# Patient Record
Sex: Male | Born: 1940 | Race: White | Hispanic: No | Smoking: Never smoker
Health system: Southern US, Community
[De-identification: ages and names within clinical notes are randomized; demographics above are authoritative.]

## PROBLEM LIST (undated history)

## (undated) DIAGNOSIS — N189 Chronic kidney disease, unspecified: Secondary | ICD-10-CM

## (undated) DIAGNOSIS — Z5189 Encounter for other specified aftercare: Secondary | ICD-10-CM

## (undated) DIAGNOSIS — I639 Cerebral infarction, unspecified: Secondary | ICD-10-CM

## (undated) DIAGNOSIS — D649 Anemia, unspecified: Secondary | ICD-10-CM

## (undated) DIAGNOSIS — J45909 Unspecified asthma, uncomplicated: Secondary | ICD-10-CM

## (undated) DIAGNOSIS — M1990 Unspecified osteoarthritis, unspecified site: Secondary | ICD-10-CM

## (undated) DIAGNOSIS — E785 Hyperlipidemia, unspecified: Secondary | ICD-10-CM

## (undated) DIAGNOSIS — I10 Essential (primary) hypertension: Secondary | ICD-10-CM

## (undated) DIAGNOSIS — K219 Gastro-esophageal reflux disease without esophagitis: Secondary | ICD-10-CM

## (undated) DIAGNOSIS — F32A Depression, unspecified: Secondary | ICD-10-CM

## (undated) DIAGNOSIS — C801 Malignant (primary) neoplasm, unspecified: Secondary | ICD-10-CM

## (undated) DIAGNOSIS — F329 Major depressive disorder, single episode, unspecified: Secondary | ICD-10-CM

## (undated) DIAGNOSIS — F419 Anxiety disorder, unspecified: Secondary | ICD-10-CM

## (undated) HISTORY — DX: Anxiety disorder, unspecified: F41.9

## (undated) HISTORY — DX: Major depressive disorder, single episode, unspecified: F32.9

## (undated) HISTORY — DX: Encounter for other specified aftercare: Z51.89

## (undated) HISTORY — DX: Malignant (primary) neoplasm, unspecified: C80.1

## (undated) HISTORY — DX: Chronic kidney disease, unspecified: N18.9

## (undated) HISTORY — DX: Anemia, unspecified: D64.9

## (undated) HISTORY — DX: Gastro-esophageal reflux disease without esophagitis: K21.9

## (undated) HISTORY — DX: Depression, unspecified: F32.A

## (undated) HISTORY — DX: Unspecified asthma, uncomplicated: J45.909

---

## 1999-01-30 ENCOUNTER — Encounter: Payer: Self-pay | Admitting: Family Medicine

## 1999-01-30 ENCOUNTER — Ambulatory Visit (HOSPITAL_COMMUNITY): Admission: RE | Admit: 1999-01-30 | Discharge: 1999-01-30 | Payer: Self-pay | Admitting: Family Medicine

## 2004-06-26 ENCOUNTER — Ambulatory Visit: Payer: Self-pay | Admitting: Family Medicine

## 2004-06-26 ENCOUNTER — Ambulatory Visit: Payer: Self-pay | Admitting: *Deleted

## 2004-07-23 ENCOUNTER — Ambulatory Visit: Payer: Self-pay | Admitting: Family Medicine

## 2004-08-05 ENCOUNTER — Ambulatory Visit: Payer: Self-pay | Admitting: Family Medicine

## 2004-08-20 ENCOUNTER — Ambulatory Visit: Payer: Self-pay | Admitting: Family Medicine

## 2004-09-17 ENCOUNTER — Ambulatory Visit: Payer: Self-pay | Admitting: Family Medicine

## 2004-11-12 ENCOUNTER — Ambulatory Visit: Payer: Self-pay | Admitting: Family Medicine

## 2004-11-13 ENCOUNTER — Ambulatory Visit: Payer: Self-pay | Admitting: Family Medicine

## 2004-11-14 ENCOUNTER — Emergency Department (HOSPITAL_COMMUNITY): Admission: EM | Admit: 2004-11-14 | Discharge: 2004-11-14 | Payer: Self-pay | Admitting: Unknown Physician Specialty

## 2004-11-15 ENCOUNTER — Ambulatory Visit: Payer: Self-pay | Admitting: Sports Medicine

## 2004-11-15 ENCOUNTER — Inpatient Hospital Stay (HOSPITAL_COMMUNITY): Admission: EM | Admit: 2004-11-15 | Discharge: 2004-11-20 | Payer: Self-pay | Admitting: Emergency Medicine

## 2004-11-18 ENCOUNTER — Ambulatory Visit: Payer: Self-pay | Admitting: Internal Medicine

## 2004-11-27 ENCOUNTER — Ambulatory Visit: Payer: Self-pay | Admitting: Family Medicine

## 2004-12-10 ENCOUNTER — Ambulatory Visit: Payer: Self-pay | Admitting: Family Medicine

## 2005-01-13 ENCOUNTER — Ambulatory Visit: Payer: Self-pay | Admitting: Internal Medicine

## 2005-01-13 ENCOUNTER — Encounter (INDEPENDENT_AMBULATORY_CARE_PROVIDER_SITE_OTHER): Payer: Self-pay | Admitting: *Deleted

## 2005-01-13 ENCOUNTER — Inpatient Hospital Stay (HOSPITAL_COMMUNITY): Admission: EM | Admit: 2005-01-13 | Discharge: 2005-01-18 | Payer: Self-pay | Admitting: Emergency Medicine

## 2005-02-18 ENCOUNTER — Ambulatory Visit: Payer: Self-pay | Admitting: Family Medicine

## 2005-04-04 ENCOUNTER — Ambulatory Visit: Payer: Self-pay | Admitting: Oncology

## 2005-11-04 ENCOUNTER — Ambulatory Visit: Payer: Self-pay | Admitting: Oncology

## 2007-02-23 ENCOUNTER — Emergency Department (HOSPITAL_COMMUNITY): Admission: EM | Admit: 2007-02-23 | Discharge: 2007-02-24 | Payer: Self-pay | Admitting: Emergency Medicine

## 2007-03-10 ENCOUNTER — Emergency Department (HOSPITAL_COMMUNITY): Admission: EM | Admit: 2007-03-10 | Discharge: 2007-03-10 | Payer: Self-pay | Admitting: Emergency Medicine

## 2007-03-13 ENCOUNTER — Inpatient Hospital Stay (HOSPITAL_COMMUNITY): Admission: EM | Admit: 2007-03-13 | Discharge: 2007-03-16 | Payer: Self-pay | Admitting: Emergency Medicine

## 2007-03-19 ENCOUNTER — Ambulatory Visit: Payer: Self-pay | Admitting: Internal Medicine

## 2007-07-23 ENCOUNTER — Emergency Department (HOSPITAL_COMMUNITY): Admission: EM | Admit: 2007-07-23 | Discharge: 2007-07-24 | Payer: Self-pay | Admitting: Emergency Medicine

## 2007-07-25 ENCOUNTER — Inpatient Hospital Stay (HOSPITAL_COMMUNITY): Admission: EM | Admit: 2007-07-25 | Discharge: 2007-07-28 | Payer: Self-pay | Admitting: *Deleted

## 2007-07-25 ENCOUNTER — Ambulatory Visit: Payer: Self-pay | Admitting: Internal Medicine

## 2007-08-06 ENCOUNTER — Emergency Department (HOSPITAL_COMMUNITY): Admission: EM | Admit: 2007-08-06 | Discharge: 2007-08-07 | Payer: Self-pay | Admitting: Emergency Medicine

## 2007-08-08 ENCOUNTER — Inpatient Hospital Stay (HOSPITAL_COMMUNITY): Admission: EM | Admit: 2007-08-08 | Discharge: 2007-08-12 | Payer: Self-pay | Admitting: Emergency Medicine

## 2007-08-08 ENCOUNTER — Ambulatory Visit: Payer: Self-pay | Admitting: Internal Medicine

## 2007-08-10 ENCOUNTER — Encounter: Payer: Self-pay | Admitting: Gastroenterology

## 2007-08-13 ENCOUNTER — Ambulatory Visit: Payer: Self-pay | Admitting: Gastroenterology

## 2007-12-22 ENCOUNTER — Emergency Department (HOSPITAL_COMMUNITY): Admission: EM | Admit: 2007-12-22 | Discharge: 2007-12-23 | Payer: Self-pay | Admitting: Emergency Medicine

## 2007-12-24 ENCOUNTER — Inpatient Hospital Stay (HOSPITAL_COMMUNITY): Admission: EM | Admit: 2007-12-24 | Discharge: 2007-12-27 | Payer: Self-pay | Admitting: Emergency Medicine

## 2007-12-24 ENCOUNTER — Emergency Department (HOSPITAL_COMMUNITY): Admission: EM | Admit: 2007-12-24 | Discharge: 2007-12-24 | Payer: Self-pay | Admitting: Emergency Medicine

## 2007-12-24 ENCOUNTER — Ambulatory Visit: Payer: Self-pay | Admitting: Cardiology

## 2007-12-30 ENCOUNTER — Ambulatory Visit: Payer: Self-pay | Admitting: Gastroenterology

## 2008-01-17 ENCOUNTER — Emergency Department (HOSPITAL_COMMUNITY): Admission: EM | Admit: 2008-01-17 | Discharge: 2008-01-17 | Payer: Self-pay | Admitting: Emergency Medicine

## 2008-01-18 ENCOUNTER — Observation Stay (HOSPITAL_COMMUNITY): Admission: EM | Admit: 2008-01-18 | Discharge: 2008-01-20 | Payer: Self-pay | Admitting: Emergency Medicine

## 2008-01-18 ENCOUNTER — Ambulatory Visit: Payer: Self-pay | Admitting: Internal Medicine

## 2008-05-15 ENCOUNTER — Emergency Department (HOSPITAL_COMMUNITY): Admission: EM | Admit: 2008-05-15 | Discharge: 2008-05-15 | Payer: Self-pay | Admitting: Emergency Medicine

## 2009-08-30 ENCOUNTER — Observation Stay (HOSPITAL_COMMUNITY): Admission: EM | Admit: 2009-08-30 | Discharge: 2009-08-31 | Payer: Self-pay | Admitting: Emergency Medicine

## 2009-12-01 ENCOUNTER — Emergency Department (HOSPITAL_COMMUNITY)
Admission: EM | Admit: 2009-12-01 | Discharge: 2009-12-01 | Payer: Self-pay | Source: Home / Self Care | Admitting: Emergency Medicine

## 2009-12-04 ENCOUNTER — Observation Stay (HOSPITAL_COMMUNITY): Admission: EM | Admit: 2009-12-04 | Discharge: 2009-12-06 | Payer: Self-pay | Admitting: Emergency Medicine

## 2009-12-04 ENCOUNTER — Ambulatory Visit: Payer: Self-pay | Admitting: Cardiovascular Disease

## 2009-12-05 ENCOUNTER — Encounter (INDEPENDENT_AMBULATORY_CARE_PROVIDER_SITE_OTHER): Payer: Self-pay | Admitting: Internal Medicine

## 2009-12-21 ENCOUNTER — Emergency Department (HOSPITAL_COMMUNITY)
Admission: EM | Admit: 2009-12-21 | Discharge: 2009-12-21 | Payer: Self-pay | Source: Home / Self Care | Admitting: Emergency Medicine

## 2010-02-09 ENCOUNTER — Emergency Department (HOSPITAL_COMMUNITY): Admission: EM | Admit: 2010-02-09 | Discharge: 2010-02-10 | Payer: Self-pay | Admitting: Emergency Medicine

## 2010-02-27 ENCOUNTER — Inpatient Hospital Stay (HOSPITAL_COMMUNITY): Admission: EM | Admit: 2010-02-27 | Discharge: 2010-02-28 | Payer: Self-pay | Admitting: Emergency Medicine

## 2010-03-09 ENCOUNTER — Emergency Department (HOSPITAL_COMMUNITY): Admission: EM | Admit: 2010-03-09 | Discharge: 2010-03-09 | Payer: Self-pay | Admitting: Emergency Medicine

## 2010-03-13 ENCOUNTER — Inpatient Hospital Stay (HOSPITAL_COMMUNITY): Admission: EM | Admit: 2010-03-13 | Discharge: 2010-03-15 | Payer: Self-pay | Admitting: Emergency Medicine

## 2010-04-01 ENCOUNTER — Ambulatory Visit: Payer: Self-pay | Admitting: Psychiatry

## 2010-11-09 LAB — DIFFERENTIAL
Basophils Absolute: 0 10*3/uL (ref 0.0–0.1)
Basophils Relative: 0 % (ref 0–1)
Lymphocytes Relative: 10 % — ABNORMAL LOW (ref 12–46)
Lymphs Abs: 1.2 10*3/uL (ref 0.7–4.0)
Monocytes Absolute: 0.6 10*3/uL (ref 0.1–1.0)
Monocytes Relative: 6 % (ref 3–12)
Neutro Abs: 11.3 10*3/uL — ABNORMAL HIGH (ref 1.7–7.7)
Neutro Abs: 9.9 10*3/uL — ABNORMAL HIGH (ref 1.7–7.7)
Neutrophils Relative %: 84 % — ABNORMAL HIGH (ref 43–77)
Neutrophils Relative %: 86 % — ABNORMAL HIGH (ref 43–77)

## 2010-11-09 LAB — GLUCOSE, CAPILLARY
Glucose-Capillary: 128 mg/dL — ABNORMAL HIGH (ref 70–99)
Glucose-Capillary: 143 mg/dL — ABNORMAL HIGH (ref 70–99)
Glucose-Capillary: 145 mg/dL — ABNORMAL HIGH (ref 70–99)
Glucose-Capillary: 148 mg/dL — ABNORMAL HIGH (ref 70–99)
Glucose-Capillary: 150 mg/dL — ABNORMAL HIGH (ref 70–99)
Glucose-Capillary: 155 mg/dL — ABNORMAL HIGH (ref 70–99)
Glucose-Capillary: 158 mg/dL — ABNORMAL HIGH (ref 70–99)
Glucose-Capillary: 178 mg/dL — ABNORMAL HIGH (ref 70–99)
Glucose-Capillary: 72 mg/dL (ref 70–99)

## 2010-11-09 LAB — CBC
HCT: 39.3 % (ref 39.0–52.0)
HCT: 39.9 % (ref 39.0–52.0)
HCT: 42.2 % (ref 39.0–52.0)
HCT: 42.3 % (ref 39.0–52.0)
HCT: 51 % (ref 39.0–52.0)
HCT: 53.7 % — ABNORMAL HIGH (ref 39.0–52.0)
Hemoglobin: 13.4 g/dL (ref 13.0–17.0)
Hemoglobin: 14 g/dL (ref 13.0–17.0)
Hemoglobin: 14.1 g/dL (ref 13.0–17.0)
Hemoglobin: 15.9 g/dL (ref 13.0–17.0)
MCH: 30.9 pg (ref 26.0–34.0)
MCH: 30.9 pg (ref 26.0–34.0)
MCH: 31 pg (ref 26.0–34.0)
MCHC: 33.3 g/dL (ref 30.0–36.0)
MCHC: 33.5 g/dL (ref 30.0–36.0)
MCHC: 33.8 g/dL (ref 30.0–36.0)
MCHC: 34 g/dL (ref 30.0–36.0)
MCV: 90.6 fL (ref 78.0–100.0)
MCV: 90.8 fL (ref 78.0–100.0)
MCV: 91.4 fL (ref 78.0–100.0)
MCV: 92 fL (ref 78.0–100.0)
MCV: 92.5 fL (ref 78.0–100.0)
Platelets: 200 10*3/uL (ref 150–400)
Platelets: 207 10*3/uL (ref 150–400)
Platelets: 235 10*3/uL (ref 150–400)
RBC: 4.27 MIL/uL (ref 4.22–5.81)
RBC: 4.34 MIL/uL (ref 4.22–5.81)
RBC: 4.54 MIL/uL (ref 4.22–5.81)
RBC: 4.57 MIL/uL (ref 4.22–5.81)
RBC: 4.6 MIL/uL (ref 4.22–5.81)
RBC: 5.14 MIL/uL (ref 4.22–5.81)
RBC: 5.63 MIL/uL (ref 4.22–5.81)
RDW: 13 % (ref 11.5–15.5)
RDW: 13.7 % (ref 11.5–15.5)
WBC: 11.5 10*3/uL — ABNORMAL HIGH (ref 4.0–10.5)
WBC: 11.8 10*3/uL — ABNORMAL HIGH (ref 4.0–10.5)
WBC: 12.6 10*3/uL — ABNORMAL HIGH (ref 4.0–10.5)
WBC: 13.2 10*3/uL — ABNORMAL HIGH (ref 4.0–10.5)
WBC: 13.9 10*3/uL — ABNORMAL HIGH (ref 4.0–10.5)
WBC: 17.6 10*3/uL — ABNORMAL HIGH (ref 4.0–10.5)

## 2010-11-09 LAB — URINALYSIS, ROUTINE W REFLEX MICROSCOPIC
Glucose, UA: 100 mg/dL — AB
Hgb urine dipstick: NEGATIVE
Nitrite: NEGATIVE
Protein, ur: >300 mg/dL — AB
Protein, ur: >300 mg/dL — AB
Urobilinogen, UA: 1 mg/dL (ref 0.0–1.0)

## 2010-11-09 LAB — COMPREHENSIVE METABOLIC PANEL
ALT: 17 U/L (ref 0–53)
AST: 21 U/L (ref 0–37)
Albumin: 4.1 g/dL (ref 3.5–5.2)
Alkaline Phosphatase: 78 U/L (ref 39–117)
BUN: 24 mg/dL — ABNORMAL HIGH (ref 6–23)
BUN: 27 mg/dL — ABNORMAL HIGH (ref 6–23)
CO2: 33 mEq/L — ABNORMAL HIGH (ref 19–32)
Calcium: 9.1 mg/dL (ref 8.4–10.5)
Calcium: 9.4 mg/dL (ref 8.4–10.5)
Chloride: 93 mEq/L — ABNORMAL LOW (ref 96–112)
GFR calc Af Amer: 51 mL/min — ABNORMAL LOW (ref >60)
GFR calc non Af Amer: 42 mL/min — ABNORMAL LOW (ref >60)
Glucose, Bld: 181 mg/dL — ABNORMAL HIGH (ref 70–99)
Glucose, Bld: 204 mg/dL — ABNORMAL HIGH (ref 70–99)
Potassium: 3.6 mEq/L (ref 3.5–5.1)
Potassium: 4.1 mEq/L (ref 3.5–5.1)
Sodium: 139 mEq/L (ref 135–145)
Total Bilirubin: 1.5 mg/dL — ABNORMAL HIGH (ref 0.3–1.2)
Total Protein: 7.3 g/dL (ref 6.0–8.3)
Total Protein: 8.2 g/dL (ref 6.0–8.3)

## 2010-11-09 LAB — BASIC METABOLIC PANEL
BUN: 21 mg/dL (ref 6–23)
BUN: 33 mg/dL — ABNORMAL HIGH (ref 6–23)
CO2: 27 mEq/L (ref 19–32)
CO2: 29 mEq/L (ref 19–32)
CO2: 29 mEq/L (ref 19–32)
Calcium: 8.8 mg/dL (ref 8.4–10.5)
Chloride: 100 mEq/L (ref 96–112)
Chloride: 101 mEq/L (ref 96–112)
Chloride: 98 mEq/L (ref 96–112)
Chloride: 99 mEq/L (ref 96–112)
Creatinine, Ser: 1.4 mg/dL (ref 0.4–1.5)
Creatinine, Ser: 1.58 mg/dL — ABNORMAL HIGH (ref 0.4–1.5)
GFR calc Af Amer: 51 mL/min — ABNORMAL LOW (ref >60)
GFR calc Af Amer: 57 mL/min — ABNORMAL LOW (ref >60)
GFR calc Af Amer: >60 mL/min (ref >60)
GFR calc non Af Amer: 42 mL/min — ABNORMAL LOW (ref >60)
Glucose, Bld: 162 mg/dL — ABNORMAL HIGH (ref 70–99)
Glucose, Bld: 94 mg/dL (ref 70–99)
Potassium: 3.3 mEq/L — ABNORMAL LOW (ref 3.5–5.1)
Potassium: 3.4 mEq/L — ABNORMAL LOW (ref 3.5–5.1)
Sodium: 133 mEq/L — ABNORMAL LOW (ref 135–145)
Sodium: 134 mEq/L — ABNORMAL LOW (ref 135–145)

## 2010-11-09 LAB — URINE CULTURE: Colony Count: NO GROWTH

## 2010-11-09 LAB — CK TOTAL AND CKMB (NOT AT ARMC)
CK, MB: 2.7 ng/mL (ref 0.3–4.0)
Relative Index: 2 (ref 0.0–2.5)
Relative Index: INVALID (ref 0.0–2.5)
Total CK: 133 U/L (ref 7–232)
Total CK: 57 U/L (ref 7–232)

## 2010-11-09 LAB — CARDIAC PANEL(CRET KIN+CKTOT+MB+TROPI)
CK, MB: 1.5 ng/mL (ref 0.3–4.0)
CK, MB: 2.1 ng/mL (ref 0.3–4.0)
Relative Index: INVALID (ref 0.0–2.5)
Relative Index: INVALID (ref 0.0–2.5)
Total CK: 43 U/L (ref 7–232)
Total CK: 56 U/L (ref 7–232)

## 2010-11-09 LAB — HEPARIN LEVEL (UNFRACTIONATED): Heparin Unfractionated: 0.37 IU/mL (ref 0.30–0.70)

## 2010-11-09 LAB — URINE MICROSCOPIC-ADD ON

## 2010-11-09 LAB — ALBUMIN: Albumin: 3.4 g/dL — ABNORMAL LOW (ref 3.5–5.2)

## 2010-11-09 LAB — FOLATE: Folate: 8.6 ng/mL

## 2010-11-09 LAB — IRON AND TIBC
Iron: 59 ug/dL (ref 42–135)
TIBC: 177 ug/dL — ABNORMAL LOW (ref 215–435)
UIBC: 118 ug/dL

## 2010-11-09 LAB — HEMOGLOBIN A1C: Mean Plasma Glucose: 154 mg/dL — ABNORMAL HIGH (ref <117)

## 2010-11-09 LAB — POCT CARDIAC MARKERS: Myoglobin, poc: 248 ng/mL (ref 12–200)

## 2010-11-09 LAB — VITAMIN B12: Vitamin B-12: 449 pg/mL (ref 211–911)

## 2010-11-09 LAB — FERRITIN: Ferritin: 425 ng/mL — ABNORMAL HIGH (ref 22–322)

## 2010-11-09 LAB — TSH: TSH: 0.737 u[IU]/mL (ref 0.350–4.500)

## 2010-11-10 LAB — COMPREHENSIVE METABOLIC PANEL
ALT: 18 U/L (ref 0–53)
ALT: 27 U/L (ref 0–53)
AST: 30 U/L (ref 0–37)
AST: 22 U/L (ref 0–37)
Albumin: 4.2 g/dL (ref 3.5–5.2)
Alkaline Phosphatase: 89 U/L (ref 39–117)
BUN: 27 mg/dL — ABNORMAL HIGH (ref 6–23)
CO2: 26 mEq/L (ref 19–32)
Calcium: 9 mg/dL (ref 8.4–10.5)
Calcium: 9.7 mg/dL (ref 8.4–10.5)
Chloride: 95 mEq/L — ABNORMAL LOW (ref 96–112)
Creatinine, Ser: 1.61 mg/dL — ABNORMAL HIGH (ref 0.4–1.5)
Creatinine, Ser: 1.7 mg/dL — ABNORMAL HIGH (ref 0.4–1.5)
GFR calc Af Amer: 52 mL/min — ABNORMAL LOW (ref >60)
GFR calc Af Amer: 49 mL/min — ABNORMAL LOW (ref >60)
GFR calc non Af Amer: 39 mL/min — ABNORMAL LOW (ref >60)
GFR calc non Af Amer: 43 mL/min — ABNORMAL LOW (ref >60)
GFR calc non Af Amer: 40 mL/min — ABNORMAL LOW (ref >60)
Glucose, Bld: 109 mg/dL — ABNORMAL HIGH (ref 70–99)
Glucose, Bld: 157 mg/dL — ABNORMAL HIGH (ref 70–99)
Potassium: 4.2 mEq/L (ref 3.5–5.1)
Sodium: 136 mEq/L (ref 135–145)
Sodium: 136 mEq/L (ref 135–145)
Total Bilirubin: 1.3 mg/dL — ABNORMAL HIGH (ref 0.3–1.2)
Total Protein: 7.3 g/dL (ref 6.0–8.3)

## 2010-11-10 LAB — URINALYSIS, ROUTINE W REFLEX MICROSCOPIC
Bilirubin Urine: NEGATIVE
Glucose, UA: 100 mg/dL — AB
Hgb urine dipstick: NEGATIVE
Ketones, ur: NEGATIVE mg/dL
Leukocytes, UA: NEGATIVE
Leukocytes, UA: NEGATIVE
Nitrite: NEGATIVE
Nitrite: NEGATIVE
Protein, ur: 100 mg/dL — AB
Protein, ur: >300 mg/dL — AB
Specific Gravity, Urine: 1.025 (ref 1.005–1.030)
Specific Gravity, Urine: 1.027 (ref 1.005–1.030)
Urobilinogen, UA: 1 mg/dL (ref 0.0–1.0)
Urobilinogen, UA: 1 mg/dL (ref 0.0–1.0)
pH: 6.5 (ref 5.0–8.0)

## 2010-11-10 LAB — CBC
HCT: 47.4 % (ref 39.0–52.0)
HCT: 51.2 % (ref 39.0–52.0)
Hemoglobin: 16.4 g/dL (ref 13.0–17.0)
MCH: 30.9 pg (ref 26.0–34.0)
MCH: 31.4 pg (ref 26.0–34.0)
MCHC: 33.6 g/dL (ref 30.0–36.0)
MCHC: 34.5 g/dL (ref 30.0–36.0)
MCHC: 34.7 g/dL (ref 30.0–36.0)
MCHC: 34.8 g/dL (ref 30.0–36.0)
MCV: 91.9 fL (ref 78.0–100.0)
MCV: 92.6 fL (ref 78.0–100.0)
MCV: 90.3 fL (ref 78.0–100.0)
Platelets: 215 10*3/uL (ref 150–400)
RDW: 13.4 % (ref 11.5–15.5)
RDW: 13.7 % (ref 11.5–15.5)
RDW: 13.1 % (ref 11.5–15.5)
WBC: 14 10*3/uL — ABNORMAL HIGH (ref 4.0–10.5)

## 2010-11-10 LAB — URINE MICROSCOPIC-ADD ON

## 2010-11-10 LAB — CARDIAC PANEL(CRET KIN+CKTOT+MB+TROPI)
CK, MB: 3 ng/mL (ref 0.3–4.0)
Relative Index: 2.4 (ref 0.0–2.5)
Relative Index: 2.5 (ref 0.0–2.5)
Total CK: 123 U/L (ref 7–232)
Troponin I: 1.31 ng/mL (ref 0.00–0.06)

## 2010-11-10 LAB — BASIC METABOLIC PANEL
CO2: 31 mEq/L (ref 19–32)
Calcium: 8.7 mg/dL (ref 8.4–10.5)
Creatinine, Ser: 1.58 mg/dL — ABNORMAL HIGH (ref 0.4–1.5)
GFR calc Af Amer: 53 mL/min — ABNORMAL LOW (ref >60)
GFR calc non Af Amer: 44 mL/min — ABNORMAL LOW (ref >60)
Sodium: 139 mEq/L (ref 135–145)

## 2010-11-10 LAB — POCT CARDIAC MARKERS
CKMB, poc: 1.7 ng/mL (ref 1.0–8.0)
CKMB, poc: 3 ng/mL (ref 1.0–8.0)
Myoglobin, poc: 195 ng/mL (ref 12–200)
Myoglobin, poc: 340 ng/mL (ref 12–200)
Troponin i, poc: <0.05 ng/mL (ref 0.00–0.09)
Troponin i, poc: <0.05 ng/mL (ref 0.00–0.09)

## 2010-11-10 LAB — TROPONIN I
Troponin I: 1.21 ng/mL (ref 0.00–0.06)
Troponin I: 1.33 ng/mL (ref 0.00–0.06)

## 2010-11-10 LAB — LIPASE, BLOOD
Lipase: 31 U/L (ref 11–59)
Lipase: 25 U/L (ref 11–59)

## 2010-11-10 LAB — DIFFERENTIAL
Basophils Absolute: 0.1 10*3/uL (ref 0.0–0.1)
Basophils Absolute: 0 10*3/uL (ref 0.0–0.1)
Basophils Relative: 1 % (ref 0–1)
Eosinophils Absolute: 0.4 10*3/uL (ref 0.0–0.7)
Eosinophils Absolute: 0 10*3/uL (ref 0.0–0.7)
Lymphocytes Relative: 12 % (ref 12–46)
Lymphocytes Relative: 8 % — ABNORMAL LOW (ref 12–46)
Lymphs Abs: 1.6 10*3/uL (ref 0.7–4.0)
Lymphs Abs: 1.1 10*3/uL (ref 0.7–4.0)
Monocytes Absolute: 0.6 10*3/uL (ref 0.1–1.0)
Monocytes Relative: 6 % (ref 3–12)
Neutro Abs: 12.3 10*3/uL — ABNORMAL HIGH (ref 1.7–7.7)
Neutrophils Relative %: 62 % (ref 43–77)
Neutrophils Relative %: 81 % — ABNORMAL HIGH (ref 43–77)

## 2010-11-10 LAB — GLUCOSE, CAPILLARY
Glucose-Capillary: 133 mg/dL — ABNORMAL HIGH (ref 70–99)
Glucose-Capillary: 97 mg/dL (ref 70–99)

## 2010-11-10 LAB — CK TOTAL AND CKMB (NOT AT ARMC)
Relative Index: 2.7 — ABNORMAL HIGH (ref 0.0–2.5)
Total CK: 113 U/L (ref 7–232)
Total CK: 129 U/L (ref 7–232)

## 2010-11-10 LAB — HEPARIN LEVEL (UNFRACTIONATED): Heparin Unfractionated: 0.23 IU/mL — ABNORMAL LOW (ref 0.30–0.70)

## 2010-11-13 LAB — CARDIAC PANEL(CRET KIN+CKTOT+MB+TROPI)
CK, MB: 1.3 ng/mL (ref 0.3–4.0)
Relative Index: INVALID (ref 0.0–2.5)
Total CK: 45 U/L (ref 7–232)
Troponin I: 1.19 ng/mL (ref 0.00–0.06)
Troponin I: 1.32 ng/mL (ref 0.00–0.06)

## 2010-11-13 LAB — DIFFERENTIAL
Basophils Relative: 0 % (ref 0–1)
Eosinophils Absolute: 0 10*3/uL (ref 0.0–0.7)
Eosinophils Relative: 0 % (ref 0–5)
Lymphocytes Relative: 10 % — ABNORMAL LOW (ref 12–46)
Lymphs Abs: 1.2 10*3/uL (ref 0.7–4.0)
Lymphs Abs: 1.3 10*3/uL (ref 0.7–4.0)
Monocytes Absolute: 1.2 10*3/uL — ABNORMAL HIGH (ref 0.1–1.0)
Monocytes Relative: 8 % (ref 3–12)
Neutro Abs: 10.1 10*3/uL — ABNORMAL HIGH (ref 1.7–7.7)
Neutro Abs: 11.5 10*3/uL — ABNORMAL HIGH (ref 1.7–7.7)

## 2010-11-13 LAB — COMPREHENSIVE METABOLIC PANEL
ALT: 20 U/L (ref 0–53)
AST: 21 U/L (ref 0–37)
Albumin: 3.6 g/dL (ref 3.5–5.2)
Albumin: 3.9 g/dL (ref 3.5–5.2)
Alkaline Phosphatase: 62 U/L (ref 39–117)
Alkaline Phosphatase: 74 U/L (ref 39–117)
BUN: 22 mg/dL (ref 6–23)
CO2: 29 mEq/L (ref 19–32)
Calcium: 9 mg/dL (ref 8.4–10.5)
Calcium: 9.3 mg/dL (ref 8.4–10.5)
Chloride: 103 mEq/L (ref 96–112)
Creatinine, Ser: 1.55 mg/dL — ABNORMAL HIGH (ref 0.4–1.5)
Creatinine, Ser: 1.63 mg/dL — ABNORMAL HIGH (ref 0.4–1.5)
GFR calc Af Amer: 54 mL/min — ABNORMAL LOW (ref >60)
GFR calc non Af Amer: 45 mL/min — ABNORMAL LOW (ref >60)
Glucose, Bld: 134 mg/dL — ABNORMAL HIGH (ref 70–99)
Potassium: 3.1 mEq/L — ABNORMAL LOW (ref 3.5–5.1)
Potassium: 3.3 mEq/L — ABNORMAL LOW (ref 3.5–5.1)
Sodium: 135 mEq/L (ref 135–145)
Total Bilirubin: 0.7 mg/dL (ref 0.3–1.2)
Total Protein: 5.3 g/dL — ABNORMAL LOW (ref 6.0–8.3)
Total Protein: 7.1 g/dL (ref 6.0–8.3)

## 2010-11-13 LAB — HEPARIN LEVEL (UNFRACTIONATED)
Heparin Unfractionated: 0.27 IU/mL — ABNORMAL LOW (ref 0.30–0.70)
Heparin Unfractionated: 0.28 IU/mL — ABNORMAL LOW (ref 0.30–0.70)

## 2010-11-13 LAB — URINALYSIS, ROUTINE W REFLEX MICROSCOPIC
Bilirubin Urine: NEGATIVE
Glucose, UA: 100 mg/dL — AB
Glucose, UA: 250 mg/dL — AB
Hgb urine dipstick: NEGATIVE
Leukocytes, UA: NEGATIVE
Nitrite: NEGATIVE
Specific Gravity, Urine: 1.021 (ref 1.005–1.030)
Specific Gravity, Urine: 1.028 (ref 1.005–1.030)
Urobilinogen, UA: >8 mg/dL — ABNORMAL HIGH (ref 0.0–1.0)
pH: 6.5 (ref 5.0–8.0)

## 2010-11-13 LAB — URINE MICROSCOPIC-ADD ON

## 2010-11-13 LAB — CBC
HCT: 39.6 % (ref 39.0–52.0)
Hemoglobin: 16.4 g/dL (ref 13.0–17.0)
MCHC: 33.7 g/dL (ref 30.0–36.0)
MCHC: 34.4 g/dL (ref 30.0–36.0)
MCHC: 34.5 g/dL (ref 30.0–36.0)
MCHC: 34.5 g/dL (ref 30.0–36.0)
MCV: 92 fL (ref 78.0–100.0)
MCV: 93.1 fL (ref 78.0–100.0)
MCV: 93.5 fL (ref 78.0–100.0)
Platelets: 169 10*3/uL (ref 150–400)
Platelets: 179 10*3/uL (ref 150–400)
Platelets: 225 10*3/uL (ref 150–400)
RBC: 4.36 MIL/uL (ref 4.22–5.81)
RBC: 5.3 MIL/uL (ref 4.22–5.81)
RDW: 13 % (ref 11.5–15.5)
RDW: 13.1 % (ref 11.5–15.5)
RDW: 13.2 % (ref 11.5–15.5)
WBC: 10.5 10*3/uL (ref 4.0–10.5)

## 2010-11-13 LAB — GLUCOSE, CAPILLARY
Glucose-Capillary: 109 mg/dL — ABNORMAL HIGH (ref 70–99)
Glucose-Capillary: 123 mg/dL — ABNORMAL HIGH (ref 70–99)
Glucose-Capillary: 15 mg/dL — CL (ref 70–99)
Glucose-Capillary: 153 mg/dL — ABNORMAL HIGH (ref 70–99)
Glucose-Capillary: 161 mg/dL — ABNORMAL HIGH (ref 70–99)
Glucose-Capillary: 169 mg/dL — ABNORMAL HIGH (ref 70–99)
Glucose-Capillary: 208 mg/dL — ABNORMAL HIGH (ref 70–99)

## 2010-11-13 LAB — CK TOTAL AND CKMB (NOT AT ARMC): Relative Index: INVALID (ref 0.0–2.5)

## 2010-11-13 LAB — POCT I-STAT, CHEM 8
BUN: 26 mg/dL — ABNORMAL HIGH (ref 6–23)
Calcium, Ion: 1.06 mmol/L — ABNORMAL LOW (ref 1.12–1.32)
Chloride: 100 mEq/L (ref 96–112)
Potassium: 3.8 mEq/L (ref 3.5–5.1)

## 2010-11-13 LAB — LIPASE, BLOOD: Lipase: 19 U/L (ref 11–59)

## 2010-11-13 LAB — TSH: TSH: 1.761 u[IU]/mL (ref 0.350–4.500)

## 2010-11-13 LAB — TROPONIN I: Troponin I: 1.44 ng/mL (ref 0.00–0.06)

## 2011-01-07 NOTE — Discharge Summary (Signed)
NAMELINCOLN, [name] NO.:  [id number]   MEDICAL RECORD NO.:  [id number]          PATIENT TYPE:  INP   LOCATION:  6730                         FACILITY:  [facility]   PHYSICIAN:  Isidor Holts, M.D.  DATE OF BIRTH:  23-Jun-1941   DATE OF ADMISSION:  03/13/2007  DATE OF DISCHARGE:  03/16/2007                               DISCHARGE SUMMARY   PRIMARY CARE PHYSICIAN:  Dr. Laveda Abbe, [facility]  Patient is  unassigned to [place].   DISCHARGE DIAGNOSES:  1. Gastric outlet obstruction, secondary to duodenal ulcer.  2. Positive Helicobacter pylori serology, commenced on eradicative      therapy.  3. Dehydration/renal insufficiency.  4. Chronic kidney disease stage 2.  5. Type 2 diabetes mellitus.  6. Gastroesophageal reflux disease.  7. Bronchial asthma.  8. Hypertension.  9. History of cerebral vascular disease.  10.History of prostate cancer.  11.History of polycythemia.   DICTATION:  1. Atenolol 50 mg p.o. daily.  2. Quinapril 20 mg p.o. daily (was on 40 mg p.o. daily).  3. Glipizide ER 5 mg p.o. daily.  4. Albuterol inhaler.  5. Proventil inhaler in pre-admission dosage.  6. Biaxin 500 mg p.o. b.i.d. for 14 days only.  7. Metronidazole 500 mg p.o. b.i.d. for 14 days only.  8. Protonix 40 mg p.o. b.i.d. for 14 days only, and then 40 mg p.o.      daily maintenance indefinitely.   PROCEDURE:  1. Abdominal x-ray dated March 13, 2007.  This showed stable distended      air fluid-filled stomach, question partial gastric outlet      obstruction and gastroparesis.  No evidence of small bowel      obstruction or pneumoperitoneum.  No evidence of acute      cardiopulmonary disease.  2. Abdominal/pelvic CT scan dated March 13, 2007.  This showed duodenal      wall thickening involving the duodenal bulb with para-duodenal fat      stranding and a few small lymph nodes present.  The findings are      most compatible with ulcer with neoplasm being less likely in this  location.  There is also hepatic granulomata.  No acute inter-      pelvic finding.  3. Upper GI series dated March 15, 2007.  This showed scarring and      irregularity of the duodenal bulb, consistent with duodenal ulcer      disease.  No evidence for structural mass.  Also a suggestion of      mild narrowing of the gastroesophageal junction, duodenal      diverticula, mild gastroesophageal reflux identified.   CONSULTATIONS:  1. Dr. Barbette Hair. Arlyce Dice, GI.  2. Dr. Hedwig Morton. Brodie, GI.   HISTORY:  As in the H&P notes of March 13, 2007, dictated by Dr. Della Goo. However, in brief this is a 70 year old male, with known  history of type 2 diabetes mellitus, bronchial asthma, prostate cancer,  hypertension, chronic renal insufficiency, previous cerebrovascular  accident and polycythemia, who presents with a several-day history of  nausea and vomiting, as well as decreased  oral intake associated with  upper abdominal pain.  He was admitted for the further evaluation,  investigation and management.   HOSPITAL COURSE:  #1 - GASTRIC OUTLET OBSTRUCTION:  For the details of  presentation, refer to the admission history, above.  The patient was  managed with bowel rest, intravenous fluid hydration, anti-emetics and  proton pump inhibitors.  An  abdominal x-ray demonstrated gastric  distention suggestive of gastric outlet obstruction.  This was confirmed  by an abdominal CT scan and upper GI series.  For details of these  findings, refer to the procedure list above.  A GI consultation was  called, and was kindly provided by Dr. Lina Sar and Dr. Melvia Heaps.  The patient was offered an upper GI endoscopy, which he has  declined.  By March 14, 2007, the patient no longer had episodes of  vomiting.  He was placed on clear fluids and his diet was advanced.  By  March 16, 2007, the patient was able to tolerate a regular diet, and we  were able to discontinue the intravenous fluids.  H.  pylori serology  done on the recommendation of the gastroenterologist, returned positive  at 3.0 for H. pylori antibodies.  The patient has therefore been started  on eradicative triple therapy with Biaxin, Metronidazole and proton pump  inhibitor.  Of note, he is allergic to PENICILLIN.   #2 - TYPE 2 DIABETES MELLITUS:  While the patient was n.p.o, this was  managed with the sliding scale insulin coverage; however, the patient is  now restarted on his pre-admission dosage of Glipizide and his CBGs were  controlled during the course of his hospitalization.  He is recommended  to follow a carbohydrate-modified diet.   #3 - DEHYDRATION/ACUTE ON CHRONIC RENAL FAILURE:  The patient at the  time of presentation, had a BUN of 30 with a creatinine of 1.72.  He has  a known history of chronic renal insufficiency with creatinine about  1.34 at baseline on February 23, 2007.  He was managed with intravenous fluid  hydration, and as of March 16, 2007, a BUN was 23 and creatinine 1.36,  i.e. at baseline.  The patient of course is a known diabetic.  He will  continue on his ACE inhibitor treatment, following discharge, and will  follow up with his primary M.D.   #4 - HYPERTENSION:  The patient remained normotensive throughout the  course of his hospitalization.  His pre-admission Hydrochlorothiazide  was discontinued, secondary to dehydration and his ACE inhibitor was  also held temporarily; however, as his renal function appears to be at  baseline, it would be prudent to continue his ACE inhibitor treatment,  given the fact that he is diabetic, for renal protective fact.  His  serum creatinine will have to be followed closely by his primary MD.  We  have re-commenced Quinapril in a reduced dose of 20 mg p.o. daily.  Will  leave titration to his primary MD.   #5 - HISTORY OF POLYCYTHEMIA:  There were no problems referrable to  this, during the course of the patient's hospitalization.  His  hemoglobin  was 18.4 at the time of presentation; however, as of March 16, 2007, his hemoglobin was 14.9 with hematocrit of 42.4.  It is likely  that the elevated level was secondary to dehydration.   #6 - HISTORY OF PROSTATE CANCER:  There were no problems referable to  this, during the course of the patient's hospitalization.  The patient  is to follow up with his primary MD.   #7 - HISTORY OF BRONCHIAL ASTHMA:  The patient remained asymptomatic  from this viewpoint, throughout the course of his hospitalization.   DISPOSITION:  The patient was considered recovered and stable on March 16, 2007, and was very keen to be discharged.  He was therefore  discharged accordingly.   DIET:  A heart-healthy diet/carbohydrate-modified diet.   ACTIVITY:  As tolerated.   FOLLOW-UP INSTRUCTIONS:  The patient is to follow up with his primary  MD, Dr. Laveda Abbe at [facility] on March 19, 2007, per the prior  scheduled appointment.      Isidor Holts, M.D.  Electronically Signed     CO/MEDQ  D:  03/16/2007  T:  03/16/2007  Job:  [id number]

## 2011-01-07 NOTE — H&P (Signed)
[name], [name] NO.:  [id number]   MEDICAL RECORD NO.:  [id number]          PATIENT TYPE:  INP   LOCATION:  6730                         FACILITY:  [facility]   PHYSICIAN:  Della Goo, M.D. DATE OF BIRTH:  Sep 17, 1940   DATE OF ADMISSION:  03/13/2007  DATE OF DISCHARGE:                              HISTORY & PHYSICAL   PRIMARY CARE PHYSICIAN:  This is an unassigned patient.   CHIEF COMPLAINT:  Increased nausea, vomiting   HISTORY OF PRESENT ILLNESS:  This is a 70 year old male with type 2  diabetes who presented to the emergency department secondary to  complaints of continued nausea and vomiting for several days along with  decreased p.o. intake.  The patient reports he has also had abdominal  pain which he describes as dull pain for the past 5-6 hours.  He denies  having any hematemesis; however, he has been vomiting bilious material..  He denies having any fevers, chills, chest pain, shortness of breath.  He also denies having any diarrhea or constipation.  He denies having  dizziness, weakness and syncope.   PAST MEDICAL HISTORY:  1. Type 2 diabetes mellitus.  2. Asthma.  3. History of untreated prostate cancer.  4. Hypertension.  5. Chronic renal insufficiency.  6. Previous cerebrovascular accident.  7. The patient also has a history of polycythemia   MEDICATIONS:  1. Atenolol 50 mg one p.o. daily.  2. Quinapril 40 mg one p.o. daily.  3. Albuterol inhaler p.r.n.   ALLERGIES:  PENICILLIN causes swelling.   SOCIAL HISTORY:  The patient is disabled secondary to his  cerebrovascular accident and currently lives with his mother.  He is a  nonsmoker, nondrinker.   FAMILY HISTORY:  Positive for diabetes mellitus in his father, positive  for prostate cancer in his paternal grandfather's brother.   REVIEW OF SYSTEMS:  Pertinent for mentioned above.   PHYSICAL EXAMINATION:  GENERAL:  A 70 year old obese male in discomfort  but no acute  distress.  VITAL SIGNS:  Temperature 97, blood pressure 137/90 to 153/85, heart  rate 52-63, respirations 18, O2 saturation 95%.  HEENT:  Normocephalic, atraumatic.  Pupils round and reactive to light.  Extraocular muscles are intact. Funduscopic benign. Oropharynx clear.  NECK:  Supple. Full range of motion.  No thyromegaly, adenopathy,  jugular venous distension.  CARDIOVASCULAR:  Regular rate and rhythm.  No murmurs, gallops or rubs.  LUNGS:  Clear to auscultation bilaterally.  ABDOMEN:  Positive bowel sounds, mildly decreased. Soft, nontender,  nondistended.  No hepatosplenomegaly.  No rebound, no guarding.  EXTREMITIES:  Without cyanosis, clubbing or edema.  RECTAL AND GENITOURINARY:  Deferred.  NEUROLOGIC:  The patient is alert and oriented x3.  He is able to move  all four of his extremities.   LABORATORY STUDIES:  White blood cell count 14.5, hemoglobin 18.4,  hematocrit 53.3, platelets 244, neutrophils 84% lymphocytes 9%.  Sodium  136, potassium 4.0, chloride 93, bicarb 32, BUN 30, creatinine 1.72,  glucose 167.  Cardiac enzymes:  Myoglobin 245, CK-MB 1.2, troponin less  than 0.05.   Abdominal films reveal possible gastric outlet  obstruction versus  gastroparesis.   Urinalysis negative for nitrites, negative for leukocyte esterase,  glucose equal to 100, trace hemoglobin, small bilirubin, and greater  than 300 protein   ASSESSMENT:  A 70 year old male being admitted with  1. Intractable nausea and vomiting.  2. Dehydration.  3. Type 2 diabetes mellitus.  4. Untreated prostate cancer.  5. Polycythemia.   PLAN:  The patient will be admitted and placed on IV fluids for  rehydration and maintenance therapy.  He will be n.p.o. for now. IV  antiemetics have been ordered along with IV Reglan for nausea.  The  patient will have a CAT scan of the abdomen and pelvis with contrast  performed to further evaluate for gastric outlet obstruction but also to  further evaluate his  prostate disease.  The patient will be placed on  Lopressor p.r.n. elevated blood pressure.  His quinapril therapy will be  placed on hold for now.  DVT and GI prophylaxes have been ordered.      Della Goo, M.D.  Electronically Signed     HJ/MEDQ  D:  03/13/2007  T:  03/14/2007  Job:  [id number]

## 2011-01-07 NOTE — Discharge Summary (Signed)
[name], MILSTEIN NO.:  [id number]   MEDICAL RECORD NO.:  [id number]          PATIENT TYPE:  INP   LOCATION:  5011                         FACILITY:  [facility]   PHYSICIAN:  Elby Showers, MD    DATE OF BIRTH:  Apr 10, 1941   DATE OF ADMISSION:  07/25/2007  DATE OF DISCHARGE:  07/28/2007                               DISCHARGE SUMMARY   DISCHARGE DIAGNOSES:  1. Gastroenteritis versus peptic ulcer disease.  2. Anemia.  3. Diabetes type 2.  4. Hypertension.  5. Hyperbilirubinemia.   DISCHARGE MEDICATIONS:  The patient is being discharged on the following  medications:  1. Atenolol 50 mg 1 tablet daily.  2. Protonix 40 mg 1 tablet daily.  3. Glipizide 5 mg 1 tablet daily.  4. Quinapril 20 mg 1 tablet daily.   DISPOSITION/FOLLOW UP:  This patient is being discharged in stable  condition.  The nausea and vomiting that brought him to the hospital  have resolved.  He is tolerating a regular diet and has no abdominal  pain.  He will be following up with Dr. Beverley Fiedler at [facility]  on August 10, 2007 at 3:15.  At this time, a CBC and BMET should be  checked.   PROCEDURE:  Procedures performed during this hospitalization include:  1. 07/26/07: Ultrasound of the abdomen. Unremarkable exam, small renal      cysts, no gallstones.  2. 07/26/07: Chest x-ray. Chronic lingular scarring, no active disease,      no evidence of pneumonia.   CONSULTING PHYSICIAN:  There were no consultations during this  hospitalization.   ADMISSION HISTORY AND PHYSICAL:  [name] is a 70 year old male with a  past medical history of untreated prostate cancer, hypertension,  diabetes type 2, GERD, peptic ulcer disease, presenting with a three-day  history of dark tarry diarrhea and nonbloody bilious vomiting.  He  denies bright red blood per rectum or coffee-ground material emesis. He  denies any recent travel history.  He notes his 85 year old mother has  been sick with a  GI illness.  He says he has been unable to keep  anything down over the last few days and has been dizzy without syncope.  He feels very weak globally.  He is unsure if he has lost weight.  He  has had some similar symptoms one year ago and was treated for peptic  ulcer disease.   PHYSICAL EXAMINATION:  VITAL SIGNS:  On presentation, temperature 97.2.  Blood pressure was 152/89.  Pulse 58.  Respirations 16.  O2 saturation  is 96% on room air.  GENERAL:  Alert, oriented male, somewhat disheveled, in no acute  distress.  HEENT:  Pupils are equal, round and reactive.  Mucous membranes dry.  RESPIRATORY:  Clear to auscultation bilaterally.  CARDIOVASCULAR EXAM:  Mildly bradycardic rate, regular rhythm.  No  murmurs, rubs or gallops.  ABDOMEN:  Positive bowel sounds, soft, nontender, nondistended.  Multiple small cherry hemangiomas over abdominal surface.  RECTAL EXAM:  Globally enlarged prostate, no nodules, good rectal tone.  Brown stool in vault. FOBT negative.  NEUROLOGIC:  Strength 5/5  in all extremities.  Cranial nerves intact.   LABORATORY DATA:  On admission, sodium 136, potassium 3.3, chloride 92,  bicarb 30, BUN 26, creatinine 1.49, glucose 167, bilirubin 2.1, alk phos  69, AST 24, ALT 18, protein 7.4, albumin 3.8, calcium 9.8.  White blood  cell count 14.2, hemoglobin 17.8, platelets 275,000, MCV is 90.8.  U/A  shows ketones greater than 300 mg/dl of protein, nitrate negative,  leukocyte esterase negative.   HOSPITAL COURSE:  1. - Gastroenteritis versus peptic ulcer disease:  The patient had an      elevated white blood cell count and recent sick contact, making      gastroenteritis very likely.  He has in the past, however, been      diagnosed with peptic ulcer disease by upper GI series in June of      2008 which showed scarring and irregularity of the duodenal bulb      consistent with ulcer disease.  No evidence of obstruction.  There      was a mild narrowing identified  in the gastroesophageal junction, a      duodenal diverticula and mild GERD was identified.  These results      support previous results in May of 2006, another upper GI series      which showed partial gastric outlet obstruction.  At that time, May      of 2006, he also had an EGD which showed severe distal esophagitis.      Ulcers were seen in the antrum along pyloric channel, significant      edema obstructing the channel itself, there was partial gastric      obstruction, debris in the duodenal bulb.  Small bowel was not      visible.  There were no visible vessels seen at this time.  Severe      gastritis was noted throughout the gastric mucosa.  During this      hospitalization, however, the patient declined EGD or colonoscopy      despite his report of melena.  He states that he has a friend who      died after an EGD, and he now considers the procedure very      dangerout.  He was continued on his Protonix throughout the hospitalization.  He was  FOBT-negative.  Multiple stool studies were ordered, but the patient has  not had a bowel movements for 24 hours prior to discharge, so there was  no sample collected.  Samples were collected for H. pylori antigen and  that result is pending.  At the time of discharge, the patient is able  to tolerate regular diet.  He reports no further emesis or diarrhea.  He  will be followed up as an outpatient.  1. - Anemia:  Hemoglobin dropped while in hospital from 17 to 12.      Suspect this was a dilutional effect.  He was volume-contracted      secondary to vomiting and diarrhea at presentation.  He was      immediately put on IV fluids.  Hemoglobin now stable greater than      24 hours at 12.8 the day prior to discharge and 14 on day of      discharge.  2. - Diabetes type 2:  A1C checked in hospital was 7.0, indicating      good outpatient control.  We will discharge on the home regimen of      5 mg of glipizide  per day.  3. - Hypertension:   The patient was very well-controlled in hospital      on atenolol and lisinopril.  Will be discharged on previous home      regimen of atenolol and quinapril.  4. - Hyperbilirubinemia:  2.1 at initial presentation.  Abdominal      ultrasound was negative for any signs of cholecystitis.  Bilirubin      trended back to normal during hospitalization.  Fractionated      bilirubin showed total bilirubin 1.9, direct bilirubin 0.3,      indirect bilirubin 1.6, LDH within normal limits at 177,      haptoglobin within normal limits at 153.  Do not suspect hemolysis.      Suspect this may have also been due to volume contraction.  5. - Hypokalemia:  On day of discharge, a.m. labs showed potassium to      be 3.0.  Will replete prior to discharge.  This should be rechecked      at his outpatient follow-up appointment.   VITAL SIGNS:  On day of discharge, temperature 97.8.  Pulse 74.  Respirations 18.  Blood pressure 147/79.  The patient is saturating 100%  on room air.   LABORATORY DATA:  CBC shows white blood cell count of 10.7, hemoglobin  14.0, hematocrit 40.3, platelet count 190,000, MCV is 91.  Sodium 135,  potassium 3.0, chloride 106, bicarb 23, BUN 14, creatinine 1.25, glucose  106, calcium 8.3.  Other labs of interest during this hospitalization:  Urine microalbumin 30.4, random urine creatinine 82.2, lipase within  normal limits at 21.  Helicobacter pylori stool antigen result is  pending.      Elby Showers, MD  Electronically Signed     CW/MEDQ  D:  07/28/2007  T:  07/28/2007  Job:  [id number]   cc:   Fanny Dance. Rankins, M.D.

## 2011-01-07 NOTE — Consult Note (Signed)
NAMETATE, ZAGAL NO.:  [id number]   MEDICAL RECORD NO.:  [id number]          PATIENT TYPE:  INP   LOCATION:  2627                         FACILITY:  [facility]   PHYSICIAN:  Reginia Forts, MD     DATE OF BIRTH:  01/05/1941   DATE OF CONSULTATION:  DATE OF DISCHARGE:                                 CONSULTATION   CONSULTING PHYSICIAN:  Hartley Barefoot, MD   REASON FOR CONSULTATION:  Elevated troponin in the settting of nausea  and vomiting.   HISTORY OF PRESENT ILLNESS:  [name] is a 70 year old Caucasian male  with a history of diabetes mellitus type 2, chronic renal insufficiency  and gastric outlet obstruction, presents as a consultation for elevated  troponin in the setting of significant nausea and vomiting.  The patient  has a history of gastric outlet obstruction secondary to duodenal  ulcers.  For the last week, the patient has had no p.o. intake and no  significant bowel movement.  He did note progressive nausea and vomiting  during the past week and subsequently was admitted today.  He denies any  hematemesis, hematochezia or melena, denies any chest pain but did note  mild shortness of breath.  He was admitted and given bowel rest.  Serial  enzymes, however, demonstrated elevation of troponin initially from 0.05  up to 2.25.  The patient does have a history of significant GI bleeding  from his duodenal ulcer in the past.  A consultation was obtained to  assess for management of elevated troponin, as well as a higher bleeding  risk, due to duodenal ulcer.   PAST MEDICAL HISTORY:  1. Asthma.  2. Hypertension.  3. Diabetes mellitus type 2.  4. Prostate cancer, untreated.  5. Hernia.  6. Chronic renal insufficiency, prior stroke 10 years ago.  7. Polycythemia vera.  8. Gastric outlet obstruction, secondary to duodenal ulcer.  9. Gastroesophageal reflux disease.  10.Depression with psychotic features.   ALLERGIES:  PENICILLIN CAUSING A  RASH.   MEDICATIONS:  1. Atenolol 50 mg p.o. daily.  2. Quinapril 20 mg p.o. daily.  3. Albuterol p.r.n.  4. Glipizide 5 mg p.o. daily.   SOCIAL HISTORY:  The patient smoked for 30 years, he is divorced but  recently quit tobacco use.  He is disabled secondary to his stroke.   FAMILY HISTORY:  Notable for father with diabetes.   REVIEW OF SYSTEMS:  Notable for frequency of urination, weakness,  nausea, vomiting and diarrhea.  Rest of total review of systems is  reviewed and is negative.   PHYSICAL EXAMINATION:  VITAL SIGNS:  Temperature 97, pulse 63, blood  pressure 139/91.  GENERAL:  The patient appears lethargic but arousable.  HEENT:  Normocephalic, atraumatic.  Pupils equally round, reactive to  light.  Extraocular movements are intact.  NECK:  No JVD, no carotid bruits.  CARDIOVASCULAR:  Regular rhythm, normal rate.  No murmurs, rubs or  gallops.  LUNGS:  Clear to auscultation bilaterally.  ABDOMEN:  Decreased breath sounds and diffusely tender to touch but  obese and otherwise nondistended.  EXTREMITIES:  Showed no  cyanosis, clubbing or edema.  MUSCULOSKELETAL:  Demonstrates no joint effusion or tenderness.  NEUROLOGIC:  Cranial nerves 2-12 grossly intact.  No focal  musculoskeletal or sensory deficits.  SKIN:  Demonstrates normal rashes.  LYMPH NODES:  No lymphadenopathy.   EKG demonstrates a rate of 60 with normal sinus rhythm and early  repolarization in the anterior lead.   LABORATORY:  BUN of 32, creatinine is 1.58, troponin is 2.25, MB is 2.2,  CK is 65, hemoglobin is 17.3, hematocrit is 1.1, platelet count is  255,000, white count is 16.5.   ASSESSMENT/PLAN:  1. This is a 70 year old Caucasian male with a history of non-ST      elevation myocardial infarction and supply demand mismatch,      secondary to acute gastric ulcer obstruction.  The patient will      require supportive care with Heparin drip.  This can be      accomplished without a bolus,  especially to minimize risk of      gastrointestinal bleeding.  Atenolol was held initially but should      be given at least at half dose.  The possibility of requiring a      half dose of atenolol in the future may be offset by possibility      for hypotension secondary to acute GI bleed.  The patient is also      to continue aspirin 81 mg daily and a Statin.  Serial CBCs will be      checked, to rule out acute bleed.  2. Disposition.  The patient will require medical stabilization for      his underlying disease.  The patient may require a stress test or      cardiac cath, depending on his symptomatology.  We will continue to      follow along.      Reginia Forts, MD  Electronically Signed     RA/MEDQ  D:  12/25/2007  T:  12/25/2007  Job:  [id number]

## 2011-01-07 NOTE — Discharge Summary (Signed)
NAMECLERENCE, [name] NO.:  [id number]   MEDICAL RECORD NO.:  [id number]          PATIENT TYPE:  INP   LOCATION:  2003                         FACILITY:  [facility]   PHYSICIAN:  Madaline Guthrie, M.D.    DATE OF BIRTH:  28-Nov-1940   DATE OF ADMISSION:  12/24/2007  DATE OF DISCHARGE:  12/27/2007                               DISCHARGE SUMMARY   DISCHARGE DIAGNOSES:  1. 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  2. Non-ST-elevation myocardial infarction.  3. Diabetes mellitus.  4. Leukocytosis, resolved.  5. Hyperbilirubinemia, known history of Gilbert's syndrome.  6. Hypertension.  7. History of tuberculosis.  8. History of prostate cancer, declining workup.  9. Depression with psychotic features.   DISCHARGE MEDICATIONS:  1. Protonix 40 mg twice a day.  2. Vancomycin 500 mg twice a day, to complete a 2-week course.  3. Metronidazole 500 mg twice a day, to complete a 2-week course.  4. Aspirin 81 mg daily.  5. Atenolol 25 mg daily.  6. Reglan 5 mg before meal and at bedtime.  7. Zocor 40 mg once daily in the evening.  8. Quinapril 20 mg daily.  9. Glipizide 5 mg daily.   DISPOSITION AND FOLLOWUP:  A followup appointment has been arranged for  the patient with Dr. Eloise Harman at the [facility].  At the followup visit, the patient is to be reviewed for resolution of  his symptoms of gastric outlet obstruction.  It is to be decided at the  followup visit whether the patient will benefit from any intervention  for his problem of gastric outlet obstruction.  The patient is also  going to follow with his own primary care physician in [place].   STUDIES:  Acute abdominal series with chest x-ray on Dec 24, 2007.  Impression, no active disease in the chest.  Air-fluid levels in  nondilated colon, which may be due to ileus or liquid stool.  The  stomach is dilated and filled with fluid gradient compatible with his  gastric outlet  obstruction as demonstrated on the prior CT scan.   CONSULTS:  1. [facility].  2. Cardiology, Dr. Reginia Forts.   BRIEF ADMISSION HISTORY AND PHYSICAL:  [name] is a 70 year old man  with history of duodenal ulcers and gastritis that were H. pylori  positive and was complicated by gastric outlet obstruction.  He  presented with nausea, vomiting, and abdominal pain.  He complained of  weight loss of about 30 pounds in the past 6 months and abdominal  bloating with intermittent nausea and vomiting of undigested foods.  His  vomitus was mainly watery.  He had been admitted 3 days prior to  admission  with the same complaint.  He mentioned of retching/hiccups  and passing streaks of blood with vomiting, but mostly his vomitus was  watery and coffee-ground, old undigested food.  He mentioned of having  bowel movement about 1-5 times per day and each time they were watery,  though he often went and just passed gas.  He denied any history of  fever,  or any dizziness, abdominal pain  or any problem with his bladder  habit.  He denied any chest pain, palpitations, loss of consciousness,  syncope, diaphoresis, or any other systemic complaints.   ADMISSION PHYSICAL EXAMINATION:  VITAL SIGNS:  Temperature 97, blood  pressure 139/91, pulse 63, respiratory rate 16, and O2 sat 97% on room  air.  GENERAL:  Not in any distress.  EYES:  Equal.  EOMI.  PERRL.  No pallor.  No icterus.  ENT:  Moist mucous membranes.  Oropharynx clear.  NECK:  Supple.  CHEST:  Clear to auscultation bilaterally.  Good air entry.  No added  sounds.  CARDIOVASCULAR:  Regular rate and rhythm.  Normal heart sounds.  No  murmurs, rubs, or gallops.  ABDOMEN:  Soft, nontender, and nondistended.  Normal bowel sounds.  No  palpable organomegaly.  EXTREMITIES:  No edema or calf swelling.  GENITOURINARY:  No CVA tenderness.  SKIN:  No rash.  Normal turgor.  LYMPH:  No lymphadenopathy.  MUSCULOSKELETAL:  No  spinal joint tenderness or swelling or any joint  inflammation.  NEURO:  Alert and oriented x3.  Cranial nerves II through XII intact.  No focal deficits.  PSYCHE:  Appropriate.   ADMISSION LABS:  Sodium 136, potassium 3.6, chloride 95, bicarbonate 28,  BUN 32, creatinine 1.58, blood glucose 146, bilirubin 2.4, alk phos 55,  ALT 16, AST 19, protein 8.1, albumin 3.9, and calcium 9.1.  Hemoglobin  17, white cells 16.5, platelet 255, and ANC 13.8.  MCV 90.  Lipase 21.  FOBT negative.  Urinalysis was positive for more than 200 protein, 50  ketones, otherwise negative.  Electrocardiogram, sinus bradycardia.   HOSPITAL COURSE BY PROBLEMS:  1. Gastric outlet obstruction.  He was admitted and placed NPO.  GI      consultation was done with [place] gastroenterologist.  The patient      had recently had extensive workup in terms of his gastric outlet      obstruction, No further investigation in that line was advised.      The patient was restarted on proton pump inhibitor and H. pylori      eradication with clarithromycin and metronidazole.  The patient was      also placed on Reglan.  The patient responded well to the regimen,      and by the second day of admission, he was able to take it by      mouth.  2. Non-ST-elevation myocardial infarction.  Given the patient's risk      factors as well as mentioning of continuous hiccups and retching, a      set of cardiac enzymes were sent for, which revealed elevated      troponin in the range of 0.05-2.25.  Cardiac evaluation was done      with Dr. Laurelyn Sickle, who recommended catheterization and Myoview, but      the patient declined any further treatment in that line.  Hence,      the patient is placed on Zocor, atenolol, and aspirin, and he is      asked to be followed up with his own primary care physician.  The      patient remained hemodynamically stable throughout his hospital      stay and he never again complained of any chest pain.  3.  Leukocytosis.  The patient did not have a source of infection and      it resolved on its own.  4. Hyperbilirubinemia.  The patient is noted to have known  Gilbert's      syndrome from previous admissions.  No further workup was      undertaken in this line.   DISCHARGE VITALS:  Blood pressure 118/80, temperature 97.9, pulse 59,  respiratory rate 18 per minute, and saturation 99% on room air.   DISCHARGE LABS:  White cells 8.5, hemoglobin 15, MCV 90, and platelets  180. Sodium 135, potassium 3.8, chloride 108, bicarbonate 23, BUN 23,  Creatinine 1.48, Glucose 102.   On the day of discharge patient's nausea and vomiting had completely  subsided and he was eating well and tolerating oral intake.      Zara Council, MD  Electronically Signed      Madaline Guthrie, M.D.  Electronically Signed    AS/MEDQ  D:  12/31/2007  T:  01/01/2008  Job:  [id number]   cc:   Vania Rea. Jarold Motto, MD, Clementeen Graham, FACP, FAGA  Venita Lick. Russella Dar, MD, Clementeen Graham

## 2011-01-10 NOTE — H&P (Signed)
[name], Edwin NO.:  [id number]   MEDICAL RECORD NO.:  [id number]          PATIENT TYPE:  INP   LOCATION:  5017                         FACILITY:  [facility]   PHYSICIAN:  Melina Fiddler, MD DATE OF BIRTH:  05/25/1941   DATE OF ADMISSION:  11/15/2004  DATE OF DISCHARGE:                                HISTORY & PHYSICAL   ADMITTING RESIDENT:  Morley Kos, M.D.   CHIEF COMPLAINT:  Vomiting x 2 weeks.   HISTORY OF PRESENT ILLNESS:  [name] is a 70 year old male with history  of CVA, prostate cancer, and asthma, presenting to the emergency department  for one-week history of vomiting and decreased p.o. intake.  He has a 20-  year history of monthly vomiting, usually occurring once a month and  resolving on its own with rest.  The patient states a one-week episode is  unusual for him.  Reported to the emergency department yesterday and  discharged with the diagnoses of possible esophagitis and hypokalemia.  The  patient was given potassium supplement and started on Protonix.  Vomiting  has not resolved since yesterday.  The patient denies chest pain or  shortness of breath.  No abdominal pain.  The patient does have associated  weakness and decreased appetite.  The patient also reported social stressors  at home due to poor relationship with mother and older sister.  Recently had  big argument today with mother.   PAST MEDICAL HISTORY:  1.  Hypertension.  2.  Asthma.  3.  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  4.  Heart disease.  5.  Prostate cancer.  6.  Herpes.  7.  CVA.   ALLERGIES:  PENICILLIN.   MEDICATIONS:  1.  Atenolol 50 mg p.o. daily.  2.  Glucotrol XL 5 mg p.o. daily.  3.  Promethazine 25 mg p.o. q.6-8h. p.r.n. nausea and vomiting.   SOCIAL HISTORY:  Disabled secondary to CVA and lives with mother, one living  son.  Smoked in his teens.  No recent alcohol use or illicit drug use.   PHYSICAL EXAMINATION:   VITAL SIGNS:  On admission, temperature 97.3, heart  rate 102, respiratory rate 118, blood pressure 141/90.  GENERAL:  Alert and oriented with hiccups, depressed mood, perseverating on  poor family relationships.  HEENT:  Atraumatic and normocephalic.  PERRLA.  EOMI.  Poor dentition.  Mucosa dry.  Oropharynx clear.  NECK:  No lymphadenopathy, no thyromegaly.  CARDIOVASCULAR:  Regular rate and rhythm.  No murmurs, gallops, or rubs  appreciated.  LUNGS:  Clear to auscultation bilaterally.  ABDOMEN:  Soft, nontender, nondistended.  Positive bowel sounds.  SKIN:  No rashes.  EXTREMITIES:  No edema, 5/5 muscle strength.  NEUROLOGIC:  Cranial nerves II-XII intact.  Moves all extremities.  Follows  commands.  Glasgow Coma Scale 15.  PSYCHIATRIC:  Depressed, perseveration on history of herpes and poor  relationship with family.   ADMISSION LABORATORY DATA AND OTHER STUDIES:  WBC 14.6, hemoglobin 19.9,  hematocrit 56.8, platelets 211, MCV 88.4.  Sodium 138, potassium 2.6,  chloride 87, bicarb 36, BUN 40,  creatinine 1.9, glucose 127.  AST 33, ALT  31, alkaline phosphatase 70, bilirubin 3.1, protein 7.5, albumin 4.0,  amylase 55, lipase 22.  PTT 29, PT 12.8, INR 1.0.  Positive gastric occult  blood.  Urinalysis:  Trace hemoglobin, moderate bilirubin, 40 ketones,  greater than 300 protein, few bacteria.  Cardiac enzymes:  CK-MB 3.2,  troponin I less than 0.05, myoglobin greater than 500.   Chest x-ray:  Admission visit prior to today's admission significant for  lingular and left lower lobe scarring/atelectasis.   Barium swallows day prior to this admission in emergency department showed  narrowing of distal esophagus questionably related reflux, no obstruction.   ASSESSMENT AND PLAN:  A 70 year old male with intractable vomiting.   1.  Vomiting.  Not certain of etiology at present.  Per patient, he has      chronic history of emesis but not this severe.  Will treat supportively      for now  with Zofran and repeat electrolytes.  Will also start IV fluids      for now.  May get ultrasound of abdomen to rule out acute process for      infection considering elevated white blood cells and elevated bilirubin.      Narrowing of distal esophagus was noted on barium swallow in emergency      room visit yesterday.  May consult GI for possible stricture.  2.  Hypokalemia.  Will replete and follow.  3.  Diabetes mellitus.  Will start home medications which include Glucotrol      XL and check CBGs q.a.c. and q.h.s.  Also will order sliding scale      insulin.  4.  Asthma.  The patient is stable on room air, but wheezing heard on exam.      Will order albuterol nebulizer x 1 and q.4h. p.r.n. and monitor.  5.  Hypertension.  Elevated currently in the 140s; however, may be secondary      to acute illness, so will for now continue his home medications and      monitor.  6.  Dehydration. Will start IV fluids and monitor. Creatinine is elevated at      1.9 most likely secondary to hypovolemia but will hydrate and monitor      creatinine.  7.  Acute renal failure probably secondary to problems above.  Will hydrate      and monitor.  Will call [facility] for baseline creatinine.  8.  Leukocytosis.  Uncertain etiology, negative UA and chest x-ray at      emergency room yesterday.  The patient is currently afebrile.  My      consider hepatitis panel considering elevated bilirubin.  9.  Disposition.  The patient voiced he is considering going to a skilled      nursing facility or assisted living due to current poor relations with      27 year old mother with whom he lives.  Will advise social work      consideration and follow.      VRE/MEDQ  D:  11/19/2004  T:  11/19/2004  Job:  [id number]   cc:   [facility]

## 2011-01-10 NOTE — Consult Note (Signed)
NAMEYANG, RACK NO.:  [id number]   MEDICAL RECORD NO.:  [id number]          PATIENT TYPE:  OBV   LOCATION:  5017                         FACILITY:  [facility]   PHYSICIAN:  Rose Phi. Myna Hidalgo, M.D. DATE OF BIRTH:  [birth year]   DATE OF CONSULTATION:  11/16/2004  DATE OF DISCHARGE:                                   CONSULTATION   REFERRING PHYSICIAN:  Anselmo Rod, M.D.   REASON FOR CONSULTATION:  Possible polycythemia.   HISTORY OF PRESENT ILLNESS:  Mr. Ruesch is a very nice 70 year old white  gentleman who has multiple medical problems.  He has a history of prostate  cancer 10 years ago.  He really cannot remember what kind of treatment he  had.  He has a history of longstanding asthma and type 2 diabetes,  hypertension and heart disease.  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   He was admitted because of persistent vomiting.  He was subsequently found  to have an esophageal stricture.  He was admitted on March 24.  On  admission, his laboratory studies showed a white cell count of 16,000,  hemoglobin 20, hematocrit 57.9, platelet count was clumped.  He was found  to have positive blood in his gastric secretions.  He had a metabolic panel  which showed a sodium of 138, potassium 2.6, BUN 4.0, creatinine 1.9.  Bilirubin was 3.1.  The rest of his LFTs were normal.  His PT was 12.8 with  a PTT of 29.  A repeat CBC was done on March 24 which showed a white cell  count of 14.6, hemoglobin 19.9, hematocrit 56.8, platelet count 211,000.  White cell differential showed 79 segs, 8 lymphs, 13 monos.   There was some concern about the possibility of an underlying hematologic  issue, and I was subsequently called to see him.  Again, he is not a great  historian.  He does have a history of a CVA.  He does have a history of  heart disease.  He really cannot be more specific about this.  He is not a  vegetarian.  He has been having difficulty swallowing for the  past month or  so.  He denies any type of weight loss.  He has not noted any kind of skin  rashes.  He has had an occasional headache.  There is no double vision or  blurred vision.  He has not had any melena or hematochezia.   PAST MEDICAL HISTORY:  As stated previously.   ALLERGIES:  PENICILLIN.   CURRENT MEDICATIONS:  1.  Potassium 40 mEq p.o. daily.  2.  Atenolol 50 mg p.o. daily.  3.  Glucotrol XL 5 mg p.o. daily.  4.  Protonix 40 mg p.o. daily.  5.  Albuterol nebulizers as needed.   SOCIAL HISTORY:  Negative for alcohol use.  He smoked a long time ago.  He  has had no obvious occupational exposures.  He used to work for __________  [employer].  He denies any exposure.  He denies any obvious risk factors for  HIV,  hepatitis.   FAMILY HISTORY:  Noncontributory.   PHYSICAL EXAMINATION:  GENERAL:  This is a mildly obese white gentleman in  no obvious distress.  VITAL SIGNS:  Temperature 97.2, pulse 105, respiratory rate 22, blood  pressure 125/84.  HEAD/NECK:  Normocephalic, atraumatic skull.  He does have some slight  facial plethora.  He does have some slight conjunctival inflammation.  Pupils react appropriately.  He has no oral lesions.  NECK:  Supple with no adenopathy.  Thyroid is nonpalpable.  LUNGS:  Decreased breath sounds throughout all lung fields.  He has an  occasional expiratory wheeze.  CARDIAC:  Tachycardiac but regular.  He has no murmurs, rubs or bruits.  ABDOMEN:  Mildly obese but soft.  He has good bowel sounds.  There is no  palpable abdominal mass.  There is no palpable hepatosplenomegaly.  BACK:  No tenderness over the spine, ribs or hips.  EXTREMITIES:  No clubbing, cyanosis or edema.  NEUROLOGIC:  No focal neurologic deficits.   LABORATORIES:  Peripheral blood smear shows a normochromic normocytic  population of red blood cells.  There are no nucleated red blood cells.  I  see no teardrop cells.  There are no target cells.  He has no  schistocytes.  White cells appear increased in number.  The increase in white cells appears  to be from mature polys.  I do not see any immature myeloid or lymphoid  cells.  There are no myeloblasts or lymphoblasts.  Platelets are adequate in  number and size.  He had a few large platelets.   IMPRESSION:  Mr. Bauernfeind is a 70 year old gentleman with persistent nausea  and vomiting.  He has an esophageal stricture.  It looks like he is going to  be undergoing an EGD with dilation.   I think that it is possible that he may have an underlying hematologic  disorder.  Again, it is hard to really tell.  The leukocytosis certainly  could be reactive.  Again, the leukocytosis certainly could also be an  indication of a polycythemia.  His increase in hemoglobin also could suggest  polycythemia.  He does not look as if he is a COPD patient with chronically  low oxygen levels, so I do not think this would be a secondary  erythrocytosis.   I do not see any issues with him having to undergo an EGD.  He should be  okay for this.   It is really hard to palpate a liver or spleen on him.  I certainly would  agree with a CT or ultrasound of the abdomen to look for the possibility of  hepatosplenomegaly.   I would send off an erythropoietin level on him.  If this is low, then this  would give more credence to his having an underlying bone marrow disorder.   It is possible that he may need to be phlebotomized.  He has already had a  stroke.  Again, this could be from hypertension.   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  This is otherwise known as [place]'s polycythemia.  This is seen in overweight middle-aged males with hypertension.  It is due  to volume depletion.   For now, I just think that we need to watch him from a hematologic  standpoint.  I do not think he needs to be phlebotomized.  Let us just get some IV fluids into him.  Let  us get his  esophagus dilated and see if this  does not improve his blood parameters.   We will certainly follow him along and make suggestions as needed.      PRE/MEDQ  D:  11/16/2004  T:  11/17/2004  Job:  [id number]   cc:   Melina Fiddler, MD  [address] [place]  [place] [address]  Fax: [phone]

## 2011-01-10 NOTE — Consult Note (Signed)
[name], [name] NO.:  [id number]   MEDICAL RECORD NO.:  [id number]          PATIENT TYPE:  OBV   LOCATION:  5017                         FACILITY:  [facility]   PHYSICIAN:  Anselmo Rod, M.D.  DATE OF BIRTH:  Aug 15, 1941   DATE OF CONSULTATION:  DATE OF DISCHARGE:                                   CONSULTATION   DATE OF CONSULTATION:  November 16, 2004.   REASON FOR CONSULTATION:  Difficulty swallowing.  Question of normal barium  swallow with esophageal stricture.  [facility] requesting an  EGD.   ASSESSMENT:  1.  Question coffee ground emesis versus hemoptysis and a possible distal      esophageal stricture seen on barium swallow.  A questionable reflux      esophagitis.  Rule out malignancy.  2.  History of reflux disease, abnormal liver function tests, total      bilirubin of 3.1.  Liver panel pending.  3.  Abnormal weight loss, weight down from 220 to 195, reasons unclear.  4.  Increased white cell count, hemoglobin, and platelet count.  Question      polycythemia vera.  5.  History of a stroke, heart disease, and lightheadedness. Question PCV.  6.  Increased BUN and creatinine.  Question dehydration.  7.  Proteinuria, etiology unclear.  8.  History of prostate cancer diagnosed by Dr. Boston Service in 1996 by      biopsy.  The patient was advised to have surgery, which he refused.  He      has had no treatment in this regard since then.  9.  Adult onset diabetes mellitus on oral hypoglycemics at home.  10. Hypertension on Accupril.  11. Asthma on nebulizers.  12. On disability.   RECOMMENDATIONS:  1.  A Hematology/Oncology evaluation has been advised as discussed with Dr.      Myna Hidalgo over the phone.  I have procured permission from Dr. Cleophas Dunker to      do so.  A CT of the chest and abdomen would be a good idea considering      his history of prostate cancer and the possible studies of hemoptysis      and his abnormal weight loss.  2.  Continue Protonix 40 mg per day.  3.  EGD on Monday with [facility].  4.  Complete liver panel to evaluate alkaline phosphatase and intact      bilirubin to further make a judgment with regards to his LFT      abnormalities.  5.  History of genital herpes.   DISCUSSION:  [name] is a complicated, 70 year old, white male  with the above-mentioned abnormalities, who came to the emergency room and  admitted earlier this week and was sent home after receiving IV fluids and  potassium supplements.  He presented to the ER again with a history of  hiccups, difficulty swallowing, spitting up blood, a question of vomiting,  and gave a history of a 15 to 20 pound weight loss.  He cannot quantify as  to how long this has occurred.  There  is some epigastric pain with reflux.  He denies a history of the use of PPIs or H2 blockers.  There is no history  of nonsteroidal use.  He has noticed occasional BRBPR, which he attributes  to his hemorrhoids.  He denies problems with melena. There is no history of  ulcers, jaundice, or colitis.  History of recurrent episodes of  lightheadedness, and for over 20 years describes episodes of periodic  vomiting after meals, but this has become more frequent in the recent past.  He denies having any abdominal surgeries.  No cardiorespiratory or  genitourinary complaints at this time.  He has been followed by Dr. Nicki Guadalajara for chest pain and had an evaluation in the past for his hypertension.   SOCIAL HISTORY:  He is disabled.  He worked for [employer] for over 20 years  until the [employer] closed down.  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  He has been on disability for over 10 years.  He denies the use  of alcohol, tobacco, or drugs.  He is divorced and has one living son.  His  daughter was killed by a gunshot wound at the age 28.  He lives with his  mother.   FAMILY HISTORY:  His mother had breast cancer over 20 years ago  and is now  69 years of age and is doing well.  She has arthritis.  His father died at  36 of an MI.  There is a history of breast cancer in a sister as well.  There is no family history of colon or stomach cancer.  He has a brother,  who has a blood disorder requiring phlebotomies.  He has seen a cancer  doctor here in [place].   GENERAL PHYSICAL EXAMINATION:  GENERAL:  A middle-aged white male, somewhat  depressed, but in no acute distress, cooperative in his manner, very  talkative.  VITAL SIGNS:  Stable.  He is afebrile.  NECK:  Supple.  CHEST:  Clear to auscultation, S1 and S2 regular.  No murmur, rub, or  gallop.  No wheezing.  ABDOMEN:  Soft, nondistended, nontender, with normal bowel sounds.  No  hepatosplenomegaly.  No masses palpable.  RECTAL:  Examination was deferred as the patient said he had one in the  emergency room; however, I cannot find a rectal exam on the chart.  SKIN:  Coarse and dry.  NEUROLOGIC:  He seems neurologically intact.   LABORATORY DATA:  White count of 16.4 with a hemoglobin of 20.5 and  hematocrit of 57.9.  Yesterday, platelets were clumped.  There were some  atypical lymphocytes seen.  Today, hemoglobin is 18.3 with white count of  21,000, hematocrit 51.8 with platelets of 243,000.  Sodium 138, potassium  3.3, chloride 88, CO2 37, BUN 45, creatinine 1.9, glucose 134, calcium 8.9,  and magnesium 3.  Hemoglobin A1c is 5.8.   PLANS AND FURTHER RECOMMENDATIONS:  Will admit and follow up.      JNM/MEDQ  D:  11/16/2004  T:  11/17/2004  Job:  [id number]   cc:   Fanny Dance. Rankins, M.D.  [address]  [place]  [place] [address]  Fax: [phone]   Boston Service, M.D.  [address]  [place]  [place] [address]  Fax: [phone]   Nicki Guadalajara, M.D.  [phone] [address]  [place], [place] [address]  Fax: [phone]   Rose Phi. Myna Hidalgo, M.D.  [address]  [facility]  [place], [place] [address] Fax: [phone]   Melina Fiddler, MD  [address] [place]  [place] [address]  Fax: [phone]

## 2011-01-10 NOTE — Discharge Summary (Signed)
[name], [name]   MEDICAL RECORD NO.:  [id number]          PATIENT TYPE:  INP   LOCATION:  5712                         FACILITY:  [facility]   PHYSICIAN:  Zetta Bills, MD          DATE OF BIRTH:  04/26/1941   DATE OF ADMISSION:  01/12/2005  DATE OF DISCHARGE:  01/18/2005                                 DISCHARGE SUMMARY   DISCHARGE DIAGNOSES:  1.  Partial gastric outlet obstruction secondary to edema surrounding a      gastric ulcer.  2.  Severe distal esophagitis.  3.  Severe gastritis.  4.  Nausea and vomiting secondary to partial gastric outlet obstruction,      severe distal esophagitis and severe gastritis.  5.  Hypertension.  6.  Type 2 diabetes mellitus, non-insulin dependent.  7.  Anxiety and depression.   DISCHARGE MEDICATIONS:  1.  Protonix 40 mg p.o. q.12h.  2.  Seroquel 25 mg p.o. q.h.s.  3.  Celexa 15 mg p.o. daily.  4.  Atenolol 25 mg p.o. daily.  5.  Glucotrol XL 5 mg p.o. daily.   DISPOSITION AND FOLLOWUP:  The patient is to be discharged to his own home  and will be followed up as follows:  1.  With Dr. Charna Elizabeth of [facility] in two weeks.  The      patient is to contact Dr. Kenna Gilbert office and to schedule a follow-up      appointment as one could not be obtained on his day of discharge due to      the weekend.  The patient will do so by calling [phone] and the      relative contact information has been provided to him.  2.  The patient has been urged to follow up with Dr. Luciana Axe of [facility] by calling [phone].  The patient has      been following up in the past with Dr. Luciana Axe and has been urged to      schedule a hospital follow-up appointment within three weeks of      discharge in order to continue receiving his regular health care.   CONSULTATIONS:  The major consultations sought during this admission was  with gastroenterology, Dr. Charna Elizabeth and Dr.  Jeani Hawking, both of  [facility] for closely following up with this patient's  hospital progress.   PROCEDURES DURING HOSPITALIZATION:  These were as follows:  1.  On Jan 13, 2005, an abdominal ultrasound was obtained that showed no      evidence of cholelithiasis or acute cholecystitis and it showed dilated      fluid-filled stomach.  2.  On Jan 14, 2005, an upper GI series was done that showed partial gastric      outlet obstruction with esophagitis, gastritis and gastric ulcerations.      The exam was limited with respect to evaluating for presence of ulcers      or malignancy given the limited amount of contrast that the patient was  able to ingest.  3.  On Jan 13, 2005, the patient underwent an esophagogastroduodenoscopy,      (EGD), with antral biopsies that revealed severe distal esophagitis, an      ulcer seen in the antrum along the pyloric channel with significant      edema obstructing the channel itself.  Partial gastric outlet      obstruction was also noted with debris in the duodenal bulb.  Small      bowel was not well visualized.  No visible vessels were seen and severe      gastritis was noted throughout the gastric mucosa.   HISTORY OF PRESENT ILLNESS:  Mr. Shevchenko is a 70 year old man with a past  medical history significant for cerebrovascular accident, prostatic cancer  and chronic obstructive lung disease who presented to the emergency  department with a 15 year history of progressive nausea and vomiting that  had worsened considerably over the past one month or so.  The patient noted  that the nausea and vomiting was associated with epigastric pain and usually  occurred postprandially with relief after vomiting.  He reports one to two  episodes of noting coffee ground emesis within the vomitus.  The patient  denied any associated chest pain or shortness of breath and also denied any  palpitations or syncopal symptoms.  He did report some  associated weakness  and decreased appetite.  He notes that he had been recently taking  significant amounts of BC Powder and Goody's Powder due to lower back and  joint pains.  He also reports strenuous and stressful relationships with his  mother and older sister in the recent past.   PAST MEDICAL HISTORY:  This is significant for:  1.  Hypertension.  2.  Asthma.  3.  Type 2 diabetes mellitus.  4.  Nonischemic cardiomyopathy.  5.  Prostate cancer.  6.  History of cerebrovascular accident with no residual neurological      deficits.   MEDICATIONS PRIOR TO PRESENTATION:  The patient was taking:  1.  Atenolol 50 mg daily.  2.  Glucotrol 5 mg p.o. daily.  3.  Phenergan 25 mg q.6-8h. p.r.n.  4.  Celexa unknown dose.  5.  Seroquel 25 mg p.o. q.h.s.   SOCIAL HISTORY:  The patient has been on Disability since he suffered a  cerebrovascular accident in spite of no residual deficits and currently  lives with his mother and has one living son.  He smoked in his teenage  years and has no recent history of alcohol or illicit drug use.   PHYSICAL EXAMINATION:  VITAL SIGNS:  On exam, the patient had a temperature  of 97.7, pulse of 61, respirations of 20, blood pressure 145/88 and an  oxygen saturation of 91% on room air.  GENERAL:  On admission, the patient was not orthostatic.  On general exam,  he appears in good general condition, slightly obese and very anxious.  RESPIRATORY:  Both lung fields are clear to auscultation.  CARDIOVASCULAR:  On examination of his cardiovascular system, pulse is  regular in rate and rhythm.  Heart sounds S1 and S2 are normal.  ABDOMEN:  This is distended, tympanitic with mild tenderness over his  epigastric and left upper quadrants.  Bowel sounds are normal.  EXTREMITIES:  He has no edema.  NEUROPSYCHIATRIC:  He appears more anxious than usual, albeit oriented to  time, person and place.  ADMISSION LABORATORIES:  The patient's admission laboratories are  as  follows:  Hemoglobin of 15.8, white cell counts of 10.2, hematocrit of 46,  MCV of 90 and a platelet count of 188,000.  Sodium of 138, potassium of 3.1,  chloride of 98, bicarbonate of 29, BUN of 23, creatinine of 1.3 and a  glucose of 95.  Calcium of 8.5.  Alkaline phosphatase of 51, AST of 15, ALT  of 13, total protein of 5.3 and albumin of 2.6.  His total bilirubin on  admission was 3.0 with 1.5 being direct and 1.5 being indirect.   HOSPITAL COURSE:  This patient's hospital course was as follows:  Problem 1.  Nausea/vomiting.  The patient had recently been admitted to the  hospital around two months ago for evaluation of the same problem but had  declined further work up and had left against medical advice.  At this time,  he was determined to get his full work up and kept reassuring [place] that he had  been compliant with all the medications and procedural changes that we will  be making in the near future.  We promptly sought a gastroenterology consult  after placing the patient on double dose proton pump inhibitor and  preliminarily got an ultrasound of the abdomen that was unrevealing.  Dr.  Loreta Ave of gastroenterology was prompt to respond and saw this patient and  suggested that he would best benefit from EGD study in order to evaluate him  for upper bowel/upper GI tract pathology.  The patient was taken for EGD on  the second day of hospitalization and this revealed pyloric channel ulcer  with severe peri-ulcer edema that was partially obstructing the gastric  outlet and even impeded entrance of the endoscope into the proximal  duodenum.  This was further confirmed by upper GI series that did not reveal  any mass, structural effect causing this obstructing but indeed an  intramural/intraluminal obstruction that was likely coming from the edema  surrounding the gastric ulcer.  During the endoscopy, Dr. Loreta Ave also obtained  antral biopsies in order to evaluate him for the presence of  Helicobacter  pylori.  On the day following the EGD study, Dr. Elnoria Howard reviewed the patient  as well as the results of both studies done and suggested that this patient  would best benefit from being n.p.o., (nil per oral), for the next 24-36  hours in order to give him gastric mucosa ample time to rest as well as not  to irritate it with any p.o. intake.  The patient was kept nil by mouth for  the duration of around 48 hours and clear liquids were then started which  the patient tolerated extremely well.  The patient was also continued on  double dose proton pump inhibitors that he was discharged on.  The plan from  Dr. Loreta Ave is to follow this patient up in two weeks from now in order to  evaluate him clinically and see what his response has been to the proton  pump inhibitor therapy.  She further plans to do a repeat EGD study on this  patient after the duration of three months in order to follow up on his pyloric channel ulcer and see if any further intervention is required.  A  gastrin assay has also been planned for in the future when this patient is  off of his proton pump inhibitors to see if he has any gastrinoma or  abnormal secretion of gastrin that could be leading to his severe gastritis  that was noted.  The patient has been  also advised upon discharge to  continue with his soft foods with low fiber intake for the temporal period  of three weeks.  The patient will further follow up with Dr. Luciana Axe of  [facility], will further ensure that his gastric symptoms have been  quelled.   Problem 2.  Hypertension.  The patient was continued on his atenolol 50 mg  p.o. daily but approximately the third to fourth day of hospitalization, it  was noted that his pulse would be consistently in the 50's and just fearing  adverse effects and not necessarily any elevated high blood pressure, we  switched him from 50 mg p.o. daily to 25 mg p.o. daily which he tolerated  very well and upon  discharge, his pulse was at 62 with blood pressure of  131/77.  Further dose adjustments may be made as deemed necessary by Dr.  Luciana Axe of [facility].   Problem 3.  Anxiety/depression.  The patient was continued on Seroquel 25 mg  p.o. q.h.s. and Celexa 15 mg p.o. q.h.s.  The patient has a lot of  suppressed guilt as well as delusional beliefs over past relationship that  he may have had and as such, attributes his current physical complaints to  that period of his life.  This may be looked into as an outpatient following  referral to a psychiatrist for professional help.   Problem 4.  Type 2 diabetes mellitus.  During his hospitalization stay, we  transitioned him to sliding scale insulin to help with his glycemic control  and good capillary blood glucose levels were noted throughout his  hospitalization stay without any elevated glycemic or hypoglycemic episodes.  Upon discharge, he will be continued on Glucotrol 5 mg XL p.o. daily.  Further dose adjustments may be made at Dr. Ephriam Knuckles office as deemed  necessary.   Problem 5.  Elevated total bilirubin.  The patient's bilirubin was  sequentially monitored during his hospitalization stay and was found to  decrease appropriately without any intervention or any obvious pathologies.  The patient was worked up for the possible presence of Wilson's Disease by  screening ceruloplasmin and this was within normal acceptable limits  discrediting the theory of Wilson's Disease.  The patient was also screened  for hemachromatosis by checking a serum ferritin level as well as a  percentage saturation of iron and this was also within normal acceptable  limits, discrediting the presence of hematochromatosis.  As previously  mentioned, sequential monitoring of the total bilirubin revealed decreasing  value and upon discharge, his bilirubin level had decreased to the level of 1.6.  It is thought at this time that this patient may be having  congenital  problems such as Gilbert's Disease that manifests itself with elevated total  bilirubin at times of physical or psychological stress.  The patient's  discharge laboratories are as follows.   DISCHARGE LABORATORIES:  Hemoglobin of 15.7, hematocrit of 44.8, white cell  count of 7.7 with platelet counts of 198,000.  Sodium of 139, potassium of  3.5, chloride of 103, bicarbonate of 27, BUN of 7, creatinine of 1.3 and a  glucose of 92.  Calcium was at 8.9.  His total bilirubin level prior to  discharge was 1.6 with normal transaminases as well as alkaline phosphatase.      JP/MEDQ  D:  01/18/2005  T:  01/18/2005  Job:  [id number]

## 2011-01-10 NOTE — Discharge Summary (Signed)
Edwin, BRAWN NO.:  Warren   MEDICAL RECORD NO.:  [id number]          PATIENT TYPE:  INP   LOCATION:  5017                         FACILITY:  [facility]   PHYSICIAN:  Melina Fiddler, MD DATE OF BIRTH:  April 30, 1941   DATE OF ADMISSION:  11/15/2004  DATE OF DISCHARGE:  11/20/2004                                 DISCHARGE SUMMARY   ADMISSION DIAGNOSIS:  1.  Vomiting, chronic.  2.  Hypokalemia.  3.  Diabetes mellitus.  4.  Asthma.  5.  Hypertension.  6.  Acute renal failure.  7.  Leukocytosis.  8.  Dehydration.   DISCHARGE DIAGNOSIS:  1.  Dysphagia.  2.  Depression with psychotic features.  3.  Polycythemia.  4.  Chronic renal failure.  5.  Hypertension.  6.  Diabetes mellitus type 2.  7.  Hepatitis A.   DISCHARGE MEDICATIONS:  Tenormin 50 mg p.o. daily, Glucotrol XL 5 mg p.o.  daily, Protonix 40 mg p.o. daily, Seroquel 25 mg p.o. q.h.s., Celexa 5 mg  p.o. daily then increase by 5 mg p.o. daily every 2 days until at 20 mg p.o.  daily.   FOLLOW UP:  [facility][name] within two weeks of discharge date.   HISTORY:  [name] is a 70 year old male with a history of CVA, prostate  cancer, and asthma, who presented to the emergency department due to a one  week history of vomiting and decreased p.o. intake.  The patient stated he  had a 20 year history of nausea and vomiting, usually occurring once a month  and resolving on its own; however, at this visit, the patient's vomiting  atypically had lasted for one week.  He reported to the emergency department  prior to day of hospital admission with a discharge diagnosis of possible  esophagitis and hypokalemia.  He was given potassium, started on Protonix,  vomiting did not resolve over the 24 hours.  The patient represents to the  emergency department for relief.  He denied chest pain, shortness of breath,  no abdominal pain.  He did not have associated weakness or decreased  appetite.  He  was tearful on admission reporting social stressors at home  due to poor relationship with mother and sister.   ADMISSION LABORATORY DATA:  WBC 14.6, hemoglobin 19.9, hematocrit 56.8,  platelets 211, MCV 88.4.  Sodium 138, potassium 2.6, chloride 87, bicarb 36,  BUN 40, creatinine 1.9, glucose 127, amylase 55, lipase 22.  PTT 29, PT  12.8, INR 1.1.  AST 33, ALT 31, bilirubin 3.1, protein 7.5, albumin 4.  Occult blood positive.  UA trace hemoglobin, moderate bilirubin, 40 ketones,  greater than 300 protein, few bacteria. CK MB 3.2, troponin I less than  0.05, myoglobin greater than 500.  Chest x-ray showed lingular and left  lower lobe scarring/atelectasis.  Barium swallow March 23 showed narrowing  of distal esophagus, questionably related to reflux, no obstruction.   HOSPITAL COURSE:  Problem 1:  Vomiting/dysphagia.  The patient was assessed by GI team who  assessed that the patient would benefit from upper GI endoscopy and CT  chest; however, the patient refused procedures.  Clinically, the patient's  vomiting and dysphagia was assessed to be likely secondary to esophageal  stricture.  Vomiting had resolved by the day of discharge.  The patient was  made n.p.o. initially and advanced slowly to a solid diet.  The patient  continued to refuse EGD for dilatation and CT to evaluate.  The patient was  sent home tolerating a solid diet with minimal vomiting.   Problem 2:  Polycythemia, stable throughout admission, this was discussed  with primary care physician, [name], note polycythemia for the past 2-3  visits, the patient remained stable throughout admission.   Problem 3:  Hypokalemia.  The patient's potassium was repleted throughout  admission p.r.n.  Potassium was 4.1 on discharge.   Problem 4:  Chronic renal insufficiency.  The patient was initially admitted  with creatinine 1.9 which was thought to be secondary to dehydration.  The  patient was hydrated appropriately with IV  fluids.  The patient's creatinine  remained between 1.2 to 1.3 the last 2-3 days of admission.  His primary  care physician was consulted as to noted creatinine baseline.  1.2 to 1.3  was noted to be the patient's baseline for the last few months.   Problem 5:  Hypertension.  The patient was placed on Tenormin to control  blood pressures.  The patient's blood pressure was well controlled  throughout the admission.  The patient remained in stable condition.   Problem 6:  Diabetes mellitus type 2.  The patient was started on Glucotrol  and sliding scale insulin.  The patient's CBGs were initially elevated with  a range of 113 to 158.  The patient was started on Glucotrol prior to  discharge.  CBGs were monitored with excellent control.  The patient was  discharged home on Glucotrol XL 5 mg p.o. daily.   Problem 7:  Hepatitis A.  Hepatitis panel was performed during hospital  admission secondary to elevated transaminases.  The patient was found to be  hepatitis A positive, however, the patient was asymptomatic.  He will need  to follow up with his primary care physician for treatment and preventative  strategy.   Problem 8:  Depression with psychotic features.  It was questioned whether  the patient was competent to make appropriate medical decisions considering  that he was refusing EGD for dilatation and chest x-ray.  The patient's son  contacted primary care team to express that the patient has been homicidal  and suicidal over the past 2-3 months as well as severely depressed and  tearful, however, he had  not attempted suicide prior to admission.  Psychology was consulted which assessed the patient was competent to make  medical decisions as well as being clinically depressed with psychotic  features.  The patient was started on the above stated medical regimen, will  need follow up by his PCP to monitor psychologic treatment.       VRE/MEDQ  D:  02/13/2005  T:  02/13/2005  Job:   [id number]

## 2011-01-10 NOTE — Discharge Summary (Signed)
NAMECARL, [name] NO.:  [id number]   MEDICAL RECORD NO.:  [id number]          PATIENT TYPE:  INP   LOCATION:  5531                         FACILITY:  [facility]   PHYSICIAN:  Alvester Morin, M.D.  DATE OF BIRTH:  09/30/1940   DATE OF ADMISSION:  08/07/2007  DATE OF DISCHARGE:  08/12/2007                               DISCHARGE SUMMARY   DISCHARGE DIAGNOSES:  1. Duodenal ulcer.  2. Gastric outlet obstruction secondary to duodenal ulcer.  3. Gastroesophageal reflux disease.  4. Diabetes mellitus type 2.  5. Hypertension.  6. Hyperbilirubinemia.  7. History of polycythemia.  8. Bronchial asthma.  9. Chronic renal insufficiency.  10.History of cerebrovascular accident over 10 years ago.  11.History of prostate cancer diagnosed over a decade ago and has gone      untreated with last PSA of 5.34.  12.History of hepatitis A.  13.History of obesity.  14.History of depression with psychotic features.   DISCHARGE MEDICATIONS:  1. Clarithromycin 500 mg p.o. b.i.d. x14 days.  2. Metronidazole 500 mg p.o. b.i.d. x14 days.  3. Protonix 40 mg p.o. b.i.d.  4. Atenolol 50 mg p.o. daily.  5. Quinapril 20 mg p.o. daily.  6. Glipizide 5 mg p.o. daily.   DISPOSITION AND FOLLOWUP:  The patient is to follow up at Outpatient  Clinic and was asked to call Outpatient Clinic after December 30th to  make a followup appointment.   PROCEDURES PERFORMED:  1. EGD on August 09, 2007, showed severe esophageal inflammation      secondary to obstruction.  A deformity in the duodenal bulb.      Adherent clot in the duodenal bulb with ulcer that is not actively      bleeding.  There was also a stricture in the duodenal bulb with      almost complete constriction and lumen diameter measuring 4 mm.      Biopsy was obtained and was positive for H. Pylori.  2. Upper GI with Gastrografin showed moderate gastric outlet      obstruction secondary to narrowing of the pylorus with deformity  of      the bulb with possible diverticula in the duodenal bulb area.   CONSULTATIONS:  Dr. Claudette Head was consulted from Gastroenterology to  perform an upper endoscopy.   ADMITTING HISTORY:  Mr Shearn is a 70 year old Caucasian male with a  history of hypertension, type 2 diabetes, chronic renal insufficiency,  asthma, GERD, history of duodenal ulcer, history of H. pylori with  negative stool antigen test last year, history of gastric outlet  obstruction, history of a CVA and history of untreated prostate cancer  who presented with a one-week history of generalized weakness, nausea,  vomiting, and anorexia.  The patient has had multiple admissions for  similar symptoms and had just been discharged from the hospital  approximately 10 days prior to this admission.  He reported that he felt  well with good appetite for about a week after discharge but started  having weakness, nausea and vomiting about 3 days prior to admission.  He had vomiting with both liquids  and solids.  He denies dysphagia but  reports that food sometimes sticks in his chest. He complains of diffuse  abdominal pain. He denies any fevers, chills, chest pain, or shortness  of breath.   ADMISSION PHYSICAL EXAMINATION:  VITAL SIGNS:  Temperature 97, blood  pressure 140/86, pulse 83, respiration 18, saturation 94% on room air.  GENERAL:  The patient is lying in bed and appears tired.  EYES:  PERRL.  EOMI.  Minimal scleral icterus.  ENT:  Mucous membrane was dry, oropharynx was clear.  NECK:  Supple.  RESPIRATION:  Clear to auscultation bilaterally with decreased bibasilar  air movement.  CARDIOVASCULAR:  Regular rate and rhythm.  GI:  Positive for bowel sounds, nontender, nondistended, the patient had  reported diffuse pain, he did not grimace or complain of pain when his  belly was palpated.  EXTREMITIES:  There was no edema, slight cyanosis of the fingertips with  prolonged capillary refill.  GU:  Positive  for enlarged prostate that is hard to palpation.  SKIN:  Positive tenting.  MUSCULOSKELETAL:  He was moving all four extremities.  NEURO:  Grossly nonfocal.   ADMISSION LABORATORY DATA:  Sodium 134, potassium 3.9, chloride 86,  bicarb 35, BUN 26, creatinine 1.61, and glucose 160.  Bilirubin 2.1, alk-  phos 77, AST 18, ALT 18, protein 7.3, albumin 3.7, calcium 8.8.  CBC:  White count 12.1 with an ANC of 9.8, hemoglobin 16.1, hematocrit 46.6,  MCV 89, platelets 222.  Amylase 39.  Hemoccult negative.  UA was  positive for glucose, 100 bilirubin, small ketones 15, protein greater  than 300, trace leukocyte-esterase, micro showed greater bacteria and  hyaline casts.   HOSPITAL COURSE BY PROBLEM:  1. Upper GI bleed:  The night of admission, the patient had around 350      ml of brown emesis that was Gastroccult positive.  On day two of      admission, the patient had another episode of coffee-ground emesis.      GI was consulted for upper endoscopy. The patient was transferred      to the ICU and started on a Protonix drip.  Dr. Russella Dar performed an      EGD which showed severe esophagitis and an ulcer in the duodenal      bulb with an adherent clot.  There was a stricture in the duodenal      bulb with almost complete constriction of the lumen.  The patient      was kept NPO and continued on an IV PPI infusion.  Pathology was      positive for H. pylori so Clarithromycin and Metronidazole were      added to treat the H. pylori.  The patient was started on a liquid      diet and tolerated it and was advanced to a soft diet.  A      Gastrografin study was performed which showed moderate gastric      outlet obstruction.  As the patient was able to tolerate p.o., he      was discharged with 14 days of Flagyl and Clarithromycin for      treatment of H. pylori as well as Protonix 40 mg twice a day.  2. Generalized weakness: Most likely secondary to volume depletion.      His blood pressure  medication was held and he was bolused with      fluid.  His weakness improved during the hospitalization with  hydration.  3. Hypertension:  The patient's antihypertensive medications were held      until the patient was hemodynamically stable.  He was discharged on      Atenolol as well as Quinapril which was added because of his      history of proteinuria.  4. Diabetes mellitus, type 2:  The patient had an A1C of 7.0 in      November of this year.  He was treated with insulin during the      hospitalization and his glucose was well-controlled.  He was      discharged on his home dose of Glipizide.  5. Cardiac risk factors:  The patient has a history of CVA.  His      initial presentation of nausea and vomiting along with his history      of diabetes was worrisome for silent MI.  EKG was checked and there      was no acute change from previous EKGs.  Cardiac enzymes were      normal.  Fasting lipids were checked and patient had an LDL of 138      and an HDL of 124.  The patient may benefit from starting a statin      as an outpatient.  6. Hyperbilirubinemia:  The patient had an elevated indirect      bilirubin.  He was worked up for hemolysis during the last      admission and workup was negative.  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  The      patient had no increase in his transaminases.  This should not stop      him from starting a statin as an outpatient.  7. History of prostate cancer untreated for greater than 10 years:      The patient's PSA was checked and it was only mildly elevated.  The      patient does not want any type of treatment for his prostate      cancer.  8. Depression:  History of depression with psychotic features. Patient      denied being depression during this hospitalization.  9. Hypokalemia:  The patient had persistent hypokalemia during this      hospitalization secondary to continuous emesis.   Potassium was      repleted prior to discharge.   DISCHARGE LABORATORY DATA:  Sodium 138, potassium 2.9, chloride 104,  bicarb 25, BUN 13, creatinine 1.15, glucose 112.  CBC:  White blood  count of 10.1, hemoglobin 12.2, hematocrit 35.2, platelets 223.  H.  pylori antibody 3.2.  RPR negative.  Lipid panel with a total  cholesterol of 192, triglycerides 148, HDL 24, LDL 138.  PT 13.4, PTT  25, INR of 1.   DISCHARGE VITAL SIGNS:  Temperature 99.2, blood pressure 143/84, pulse  81, respiration 20, saturation 97% on room air.      Marinda Elk, M.D.  Electronically Signed      Alvester Morin, M.D.  Electronically Signed    AF/MEDQ  D:  08/13/2007  T:  08/14/2007  Job:  [id number]   cc:   Venita Lick. Russella Dar, MD, Clementeen Graham

## 2011-01-10 NOTE — Op Note (Signed)
[name], [name] NO.:  [id number]   MEDICAL RECORD NO.:  [id number]          PATIENT TYPE:  INP   LOCATION:  5712                         FACILITY:  [facility]   PHYSICIAN:  Anselmo Rod, M.D.  DATE OF BIRTH:  [birth year]   DATE OF PROCEDURE:  01/13/2005  DATE OF DISCHARGE:                                 OPERATIVE REPORT   PROCEDURE PERFORMED:  Esophagogastroduodenoscopy with antral biopsies.   ENDOSCOPIST:  Anselmo Rod, M.D.   INSTRUMENT USED:  Olympus video panendoscope.   INDICATIONS FOR PROCEDURE:  A 70 year old white male with a history of  dysphagia, nausea and vomiting.  Rule out peptic ulcer disease, esophagitis,  gastritis, etc.   PRE-PROCEDURE PREPARATION:  Informed consent was procured from the patient.  The patient was fasted for eight hours prior to the procedure.   PRE-PROCEDURE PHYSICAL:  VITAL SIGNS:  The patient had stable vital signs.  NECK:  Supple.  CHEST:  Clear to auscultation.  CARDIOVASCULAR:  S1, S2 regular.  ABDOMEN:  Soft, with normal bowel sounds.   DESCRIPTION OF THE PROCEDURE:  The patient was placed in the left lateral  decubitus position, sedated with 60 mg of Demerol and 7.5 mg of Versed in  slow incremental doses.  Once the patient was adequately sedated and  maintained on low-flow oxygen and continuous cardiac monitoring, the Olympus  video panendoscope was advanced through the mouthpiece, through the tongue,  into the esophagus under direct vision.  The proximal esophagus appeared  normal.  There was evidence of severe distal esophagitis (grade 3 to grade  4).  Also was seen in the antrum significant edema surrounding the ulcer,  with obstruction of the pyloric channel, erosions and ulceration was noted  in the pyloric channel.  As well, the small bowel was not visualized.  There  was some debris in the bulb.  There was a large amount of debris in the  stomach along the greater curvature.  Retroflexion in the  high cardia  revealed severe gastritis.  Complete visualization of the entire gastric  mucosa was not possible.  The patient tolerated the procedure well, without  complications.   IMPRESSION:  1. Severe distal esophagitis.  2. Ulcer seen in the antrum, along the pyloric channel, with significant      edema obstructing the channel itself.  3. Partial gastric outlet obstruction, debris in the duodenal bulb.  Small      bowel not visualized.  4. No visible vessels seen.  5. Severe gastritis noted throughout the gastric mucosa.     RECOMMENDATIONS:  1. Soft low-residue diet has been advised.  2. Upper GI series will be done tomorrow morning, as discussed with the      radiologist.  3. Protonix 40 mg b.i.d. to be given intravenously.  4. Repeat endoscopy in three to four months to document healing of this      ulcer.  5. Strict avoidance of all nonsteroidals for now.        JNM/MEDQ  D:  01/14/2005  T:  01/15/2005  Job:  [id number]   cc:  Hillery Aldo, M.D.  Int. Med[facility] - Resident - [address]  [place], [place] [address]  Fax: [phone]

## 2011-05-20 LAB — DIFFERENTIAL
Basophils Absolute: 0.1
Lymphocytes Relative: 14
Lymphs Abs: 1.9
Monocytes Absolute: 1.2 — ABNORMAL HIGH
Monocytes Relative: 9
Neutro Abs: 10.2 — ABNORMAL HIGH

## 2011-05-20 LAB — POCT I-STAT, CHEM 8
BUN: 28 — ABNORMAL HIGH
Calcium, Ion: 1.09 — ABNORMAL LOW
Chloride: 97

## 2011-05-20 LAB — CBC
Hemoglobin: 16.6
RBC: 5.53
WBC: 13.4 — ABNORMAL HIGH

## 2011-05-20 LAB — HEPATIC FUNCTION PANEL
ALT: 20
AST: 23
Albumin: 3.7
Alkaline Phosphatase: 64
Bilirubin, Direct: 0.5 — ABNORMAL HIGH
Indirect Bilirubin: 2.2 — ABNORMAL HIGH
Total Bilirubin: 2.7 — ABNORMAL HIGH
Total Protein: 7.1

## 2011-05-20 LAB — URINALYSIS, ROUTINE W REFLEX MICROSCOPIC
Hgb urine dipstick: NEGATIVE
Protein, ur: >300 — AB
Urobilinogen, UA: 1

## 2011-05-20 LAB — POCT CARDIAC MARKERS
Operator id: 294341
Troponin i, poc: <0.05

## 2011-05-20 LAB — URINE MICROSCOPIC-ADD ON

## 2011-05-21 LAB — BASIC METABOLIC PANEL
BUN: 22
BUN: 28 — ABNORMAL HIGH
CO2: 29
Calcium: 8.3 — ABNORMAL LOW
Chloride: 103
Chloride: 104
Creatinine, Ser: 1.11
Creatinine, Ser: 1.32
Glucose, Bld: 98
Potassium: 3.2 — ABNORMAL LOW

## 2011-05-21 LAB — DIFFERENTIAL
Basophils Absolute: 0
Basophils Relative: 0
Basophils Relative: 0
Eosinophils Absolute: 0
Lymphs Abs: 1.2
Monocytes Absolute: 0.8
Monocytes Relative: 6
Neutro Abs: 10.7 — ABNORMAL HIGH
Neutrophils Relative %: 81 — ABNORMAL HIGH
Neutrophils Relative %: 83 — ABNORMAL HIGH

## 2011-05-21 LAB — URINE MICROSCOPIC-ADD ON

## 2011-05-21 LAB — COMPREHENSIVE METABOLIC PANEL
ALT: 15
ALT: 61 — ABNORMAL HIGH
AST: 19
AST: 84 — ABNORMAL HIGH
Alkaline Phosphatase: 35 — ABNORMAL LOW
Alkaline Phosphatase: 63
Alkaline Phosphatase: 77
BUN: 26 — ABNORMAL HIGH
CO2: 23
CO2: 31
CO2: 36 — ABNORMAL HIGH
Calcium: 8.4
Chloride: 108
Chloride: 89 — ABNORMAL LOW
Creatinine, Ser: 1.14
GFR calc Af Amer: >60
GFR calc Af Amer: >60
GFR calc non Af Amer: 43 — ABNORMAL LOW
GFR calc non Af Amer: >60
Glucose, Bld: 132 — ABNORMAL HIGH
Glucose, Bld: 171 — ABNORMAL HIGH
Potassium: 2.9 — ABNORMAL LOW
Potassium: 3 — ABNORMAL LOW
Potassium: 5.2 — ABNORMAL HIGH
Sodium: 138
Total Bilirubin: 0.8
Total Bilirubin: 2 — ABNORMAL HIGH
Total Protein: 6.2

## 2011-05-21 LAB — CBC
HCT: 42
HCT: 45.8
HCT: 49.5
Hemoglobin: 14.6
Hemoglobin: 15.5
Hemoglobin: 17.3 — ABNORMAL HIGH
MCHC: 33.8
MCV: 89.9
MCV: 90.5
MCV: 90.9
Platelets: 191
RBC: 4.64
RBC: 5.09
RDW: 13.1
WBC: 10
WBC: 12.1 — ABNORMAL HIGH
WBC: 13.2 — ABNORMAL HIGH
WBC: 14.3 — ABNORMAL HIGH

## 2011-05-21 LAB — URINALYSIS, ROUTINE W REFLEX MICROSCOPIC
Bilirubin Urine: NEGATIVE
Glucose, UA: NEGATIVE
Ketones, ur: 15 — AB
Protein, ur: >300 — AB
Protein, ur: >300 — AB
Urobilinogen, UA: 2 — ABNORMAL HIGH
Urobilinogen, UA: 2 — ABNORMAL HIGH

## 2011-05-21 LAB — CARDIAC PANEL(CRET KIN+CKTOT+MB+TROPI)
CK, MB: 1
CK, MB: 1.5
Relative Index: INVALID
Total CK: 58
Troponin I: 1.82
Troponin I: 1.83

## 2011-05-21 LAB — POCT I-STAT, CHEM 8
BUN: 35 — ABNORMAL HIGH
Calcium, Ion: 0.98 — ABNORMAL LOW
Chloride: 90 — ABNORMAL LOW
Glucose, Bld: 140 — ABNORMAL HIGH
Potassium: 3.1 — ABNORMAL LOW

## 2011-05-21 LAB — CORTISOL: Cortisol, Plasma: 22.8

## 2011-05-21 LAB — MAGNESIUM: Magnesium: 2.1

## 2011-05-21 LAB — HEMOGLOBIN A1C: Mean Plasma Glucose: 161

## 2011-05-21 LAB — URINE DRUGS OF ABUSE SCREEN W ALC, ROUTINE (REF LAB)
Amphetamine Screen, Ur: NEGATIVE
Benzodiazepines.: NEGATIVE
Marijuana Metabolite: NEGATIVE
Methadone: NEGATIVE
Propoxyphene: NEGATIVE

## 2011-05-21 LAB — CREATININE, URINE, RANDOM: Creatinine, Urine: 98.4

## 2011-05-26 LAB — POCT CARDIAC MARKERS
CKMB, poc: 2.1
Myoglobin, poc: 184
Myoglobin, poc: 295
Troponin i, poc: <0.05

## 2011-05-26 LAB — GLUCOSE, CAPILLARY
Glucose-Capillary: 219 — ABNORMAL HIGH
Glucose-Capillary: 223 — ABNORMAL HIGH

## 2011-05-26 LAB — HEPATIC FUNCTION PANEL
Bilirubin, Direct: 0.3
Total Bilirubin: 2.2 — ABNORMAL HIGH

## 2011-05-26 LAB — DIFFERENTIAL
Basophils Relative: 0
Eosinophils Absolute: 0.1
Neutrophils Relative %: 81 — ABNORMAL HIGH

## 2011-05-26 LAB — CBC
MCHC: 33.9
MCV: 91.5
Platelets: 181
WBC: 13.2 — ABNORMAL HIGH

## 2011-05-26 LAB — POCT I-STAT, CHEM 8
HCT: 53 — ABNORMAL HIGH
Hemoglobin: 18 — ABNORMAL HIGH
Sodium: 135
TCO2: 28

## 2011-05-26 LAB — URIC ACID: Uric Acid, Serum: 9.3 — ABNORMAL HIGH

## 2011-05-30 LAB — CBC
HCT: 33.8 — ABNORMAL LOW
HCT: 35.2 — ABNORMAL LOW
HCT: 35.4 — ABNORMAL LOW
HCT: 36.8 — ABNORMAL LOW
Hemoglobin: 12.2 — ABNORMAL LOW
Hemoglobin: 12.8 — ABNORMAL LOW
Hemoglobin: 13.2
Hemoglobin: 15.1
MCHC: 34.2
MCHC: 34.2
MCHC: 34.3
MCHC: 34.6
MCHC: 34.6
MCHC: 34.7
MCV: 89.7
MCV: 91
MCV: 91.1
MCV: 91.4
MCV: 91.5
Platelets: 187
Platelets: 188
Platelets: 198
Platelets: 223
RBC: 3.81 — ABNORMAL LOW
RBC: 3.88 — ABNORMAL LOW
RBC: 4.04 — ABNORMAL LOW
RBC: 4.2 — ABNORMAL LOW
RBC: 4.83
RDW: 12.9
WBC: 10.1
WBC: 10.9 — ABNORMAL HIGH
WBC: 11.4 — ABNORMAL HIGH
WBC: 14 — ABNORMAL HIGH
WBC: 15 — ABNORMAL HIGH
WBC: 15.7 — ABNORMAL HIGH
WBC: 19.8 — ABNORMAL HIGH

## 2011-05-30 LAB — BASIC METABOLIC PANEL
BUN: 17
CO2: 23
CO2: 24
CO2: 25
CO2: 33 — ABNORMAL HIGH
Calcium: 7.5 — ABNORMAL LOW
Calcium: 7.8 — ABNORMAL LOW
Calcium: 8.7
Chloride: 104
Chloride: 107
Chloride: 109
Creatinine, Ser: 1.14
Creatinine, Ser: 1.22
Creatinine, Ser: 1.34
GFR calc Af Amer: >60
GFR calc Af Amer: >60
GFR calc Af Amer: >60
Glucose, Bld: 90
Potassium: 2.9 — ABNORMAL LOW
Sodium: 138
Sodium: 140

## 2011-05-30 LAB — CROSSMATCH
ABO/RH(D): O NEG
Antibody Screen: NEGATIVE

## 2011-05-30 LAB — ABO/RH: ABO/RH(D): O NEG

## 2011-06-02 LAB — BASIC METABOLIC PANEL
BUN: 14
CO2: 32
Calcium: 8.6
Creatinine, Ser: 1.41
GFR calc Af Amer: >60
GFR calc non Af Amer: 50 — ABNORMAL LOW
GFR calc non Af Amer: 58 — ABNORMAL LOW
Potassium: 3 — ABNORMAL LOW
Sodium: 135
Sodium: 137

## 2011-06-02 LAB — URINALYSIS, ROUTINE W REFLEX MICROSCOPIC
Glucose, UA: 100 — AB
Hgb urine dipstick: NEGATIVE
Ketones, ur: 15 — AB
Nitrite: NEGATIVE
Protein, ur: >300 — AB
Specific Gravity, Urine: 1.026
Urobilinogen, UA: 2 — ABNORMAL HIGH
pH: 8

## 2011-06-02 LAB — CBC
HCT: 37.3 — ABNORMAL LOW
HCT: 40.3
HCT: 41.7
HCT: 46.3
HCT: 46.6
Hemoglobin: 14.9
Hemoglobin: 15.1
Hemoglobin: 16.1
MCHC: 33.9
MCHC: 34.6
MCHC: 34.8
MCV: 89.8
MCV: 89.8
MCV: 92
Platelets: 190
Platelets: 220
Platelets: 222
RBC: 4.05 — ABNORMAL LOW
RBC: 4.74
RBC: 4.88
RBC: 5.15
RDW: 12.8
WBC: 10.7 — ABNORMAL HIGH
WBC: 13.4 — ABNORMAL HIGH
WBC: 9.6

## 2011-06-02 LAB — COMPREHENSIVE METABOLIC PANEL
Albumin: 2.8 — ABNORMAL LOW
Albumin: 3.7
Alkaline Phosphatase: 52
Alkaline Phosphatase: 77
BUN: 22
BUN: 26 — ABNORMAL HIGH
BUN: 30 — ABNORMAL HIGH
CO2: 25
Calcium: 8.8
Chloride: 108
Creatinine, Ser: 1.33
Creatinine, Ser: 1.61 — ABNORMAL HIGH
GFR calc Af Amer: >60
GFR calc non Af Amer: 54 — ABNORMAL LOW
Glucose, Bld: 160 — ABNORMAL HIGH
Potassium: 2.9 — ABNORMAL LOW
Potassium: 3.6
Total Bilirubin: 1
Total Protein: 5.4 — ABNORMAL LOW
Total Protein: 7.3

## 2011-06-02 LAB — POCT CARDIAC MARKERS
CKMB, poc: 1.5
CKMB, poc: 2.1
Myoglobin, poc: 258
Troponin i, poc: <0.05
Troponin i, poc: <0.05

## 2011-06-02 LAB — TROPONIN I: Troponin I: 0.02

## 2011-06-02 LAB — CORTISOL-AM, BLOOD: Cortisol - AM: 31.5 — ABNORMAL HIGH

## 2011-06-02 LAB — LACTATE DEHYDROGENASE: LDH: 177

## 2011-06-02 LAB — I-STAT 8, (EC8 V) (CONVERTED LAB)
Acid-Base Excess: 11 — ABNORMAL HIGH
Chloride: 97
HCT: 53 — ABNORMAL HIGH
Hemoglobin: 18 — ABNORMAL HIGH
Potassium: 3.4 — ABNORMAL LOW
pH, Ven: 7.537 — ABNORMAL HIGH

## 2011-06-02 LAB — CREATININE, URINE, RANDOM: Creatinine, Urine: 82.2

## 2011-06-02 LAB — APTT: aPTT: 25

## 2011-06-02 LAB — URINE MICROSCOPIC-ADD ON

## 2011-06-02 LAB — LIPID PANEL
Cholesterol: 192
HDL: 24 — ABNORMAL LOW
Total CHOL/HDL Ratio: 8
Triglycerides: 148

## 2011-06-02 LAB — DIFFERENTIAL
Basophils Relative: 0
Eosinophils Absolute: 0 — ABNORMAL LOW
Eosinophils Relative: 0
Monocytes Relative: 8
Neutrophils Relative %: 81 — ABNORMAL HIGH

## 2011-06-02 LAB — TSH: TSH: 2.303

## 2011-06-02 LAB — OCCULT BLOOD X 1 CARD TO LAB, STOOL: Fecal Occult Bld: NEGATIVE

## 2011-06-02 LAB — CK TOTAL AND CKMB (NOT AT ARMC)
CK, MB: 1.6
Total CK: 82

## 2011-06-02 LAB — CULTURE, BLOOD (ROUTINE X 2)

## 2011-06-02 LAB — PSA: PSA: 5.34 — ABNORMAL HIGH

## 2011-06-02 LAB — BILIRUBIN, FRACTIONATED(TOT/DIR/INDIR): Indirect Bilirubin: 2 — ABNORMAL HIGH

## 2011-06-02 LAB — LIPASE, BLOOD: Lipase: 21

## 2011-06-02 LAB — MAGNESIUM: Magnesium: 2

## 2011-06-02 LAB — POCT I-STAT CREATININE: Operator id: 284141

## 2011-06-02 LAB — PROTIME-INR: Prothrombin Time: 13.4

## 2011-06-03 LAB — URINALYSIS, ROUTINE W REFLEX MICROSCOPIC
Glucose, UA: NEGATIVE
Glucose, UA: NEGATIVE
Hgb urine dipstick: NEGATIVE
Hgb urine dipstick: NEGATIVE
Ketones, ur: 15 — AB
Ketones, ur: 15 — AB
Protein, ur: >300 — AB
Protein, ur: >300 — AB

## 2011-06-03 LAB — DIFFERENTIAL
Eosinophils Absolute: 0 — ABNORMAL LOW
Eosinophils Relative: 0
Lymphs Abs: 1.2
Monocytes Relative: 8

## 2011-06-03 LAB — I-STAT 8, (EC8 V) (CONVERTED LAB)
Acid-Base Excess: 7 — ABNORMAL HIGH
Acid-Base Excess: 9 — ABNORMAL HIGH
Bicarbonate: 31.8 — ABNORMAL HIGH
HCT: 56 — ABNORMAL HIGH
Hemoglobin: 19 — ABNORMAL HIGH
Hemoglobin: 19 — ABNORMAL HIGH
Operator id: 257131
Operator id: 272551
Potassium: 3.4 — ABNORMAL LOW
Sodium: 136
Sodium: 136
TCO2: 33
TCO2: 36

## 2011-06-03 LAB — POCT I-STAT CREATININE
Creatinine, Ser: 1.5
Creatinine, Ser: 1.6 — ABNORMAL HIGH
Operator id: 257131
Operator id: 272551

## 2011-06-03 LAB — CBC
MCHC: 34.2
Platelets: 271
RBC: 5.74
RBC: 5.75
WBC: 13.4 — ABNORMAL HIGH

## 2011-06-03 LAB — BILIRUBIN, FRACTIONATED(TOT/DIR/INDIR)
Bilirubin, Direct: 0.3
Indirect Bilirubin: 1.6 — ABNORMAL HIGH

## 2011-06-03 LAB — COMPREHENSIVE METABOLIC PANEL
ALT: 18
AST: 24
CO2: 30
Calcium: 9.5
GFR calc Af Amer: 57 — ABNORMAL LOW
GFR calc non Af Amer: 47 — ABNORMAL LOW
Sodium: 136
Total Protein: 7.4

## 2011-06-03 LAB — URINE MICROSCOPIC-ADD ON

## 2011-06-03 LAB — OCCULT BLOOD X 1 CARD TO LAB, STOOL: Fecal Occult Bld: NEGATIVE

## 2011-06-03 LAB — LIPASE, BLOOD: Lipase: 22

## 2011-06-09 LAB — BASIC METABOLIC PANEL
BUN: 23
BUN: 29 — ABNORMAL HIGH
CO2: 28
Chloride: 104
Creatinine, Ser: 1.36
GFR calc non Af Amer: 53 — ABNORMAL LOW
Glucose, Bld: 80
Glucose, Bld: 90
Potassium: 3.2 — ABNORMAL LOW
Potassium: 3.9
Sodium: 138

## 2011-06-09 LAB — COMPREHENSIVE METABOLIC PANEL
AST: 19
Albumin: 3.9
Alkaline Phosphatase: 68
Chloride: 93 — ABNORMAL LOW
GFR calc Af Amer: 49 — ABNORMAL LOW
Potassium: 4
Sodium: 136
Total Bilirubin: 2.1 — ABNORMAL HIGH

## 2011-06-09 LAB — DIFFERENTIAL
Basophils Absolute: 0.1
Basophils Relative: 0
Eosinophils Absolute: 0
Eosinophils Relative: 0
Lymphocytes Relative: 9 — ABNORMAL LOW
Lymphs Abs: 1.3
Monocytes Absolute: 0.9 — ABNORMAL HIGH
Monocytes Relative: 7
Neutro Abs: 12.1 — ABNORMAL HIGH
Neutrophils Relative %: 84 — ABNORMAL HIGH

## 2011-06-09 LAB — COMPREHENSIVE METABOLIC PANEL WITH GFR
ALT: 22
BUN: 30 — ABNORMAL HIGH
CO2: 32
Calcium: 9.7
Creatinine, Ser: 1.72 — ABNORMAL HIGH
GFR calc non Af Amer: 40 — ABNORMAL LOW
Glucose, Bld: 167 — ABNORMAL HIGH
Total Protein: 7.3

## 2011-06-09 LAB — I-STAT 8, (EC8 V) (CONVERTED LAB)
BUN: 25 — ABNORMAL HIGH
Chloride: 100
HCT: 55 — ABNORMAL HIGH
Hemoglobin: 18.7 — ABNORMAL HIGH
Operator id: 282201
Sodium: 135
pCO2, Ven: 39.3 — ABNORMAL LOW

## 2011-06-09 LAB — CBC
HCT: 42.4
HCT: 45.1
HCT: 53.3 — ABNORMAL HIGH
Hemoglobin: 15.6
Hemoglobin: 18.4 — ABNORMAL HIGH
MCHC: 34.5
MCHC: 34.6
MCV: 90.5
MCV: 91.2
Platelets: 185
Platelets: 210
Platelets: 244
RBC: 5.84 — ABNORMAL HIGH
RDW: 12.8
RDW: 12.9
RDW: 13
WBC: 14.5 — ABNORMAL HIGH
WBC: 8.4

## 2011-06-09 LAB — CK TOTAL AND CKMB (NOT AT ARMC)
CK, MB: 3
Relative Index: 2.1
Relative Index: 2.5
Total CK: 118

## 2011-06-09 LAB — URINE MICROSCOPIC-ADD ON

## 2011-06-09 LAB — URINALYSIS, ROUTINE W REFLEX MICROSCOPIC
Glucose, UA: 100 — AB
Ketones, ur: 15 — AB
Leukocytes, UA: NEGATIVE
Nitrite: NEGATIVE
Protein, ur: >300 — AB
Specific Gravity, Urine: 1.03
Urobilinogen, UA: 2 — ABNORMAL HIGH
pH: 6

## 2011-06-09 LAB — POCT CARDIAC MARKERS
Myoglobin, poc: 245
Operator id: 284251

## 2011-06-09 LAB — FREE PSA: PSA, Free: 0.8

## 2011-06-09 LAB — TROPONIN I: Troponin I: 0.01

## 2011-06-09 LAB — TSH: TSH: 2.427

## 2011-06-09 LAB — HEMOGLOBIN A1C: Hgb A1c MFr Bld: 7.3 — ABNORMAL HIGH

## 2011-06-09 LAB — H. PYLORI ANTIBODY, IGG: H Pylori IgG: 3 — ABNORMAL HIGH

## 2011-06-10 LAB — URINALYSIS, ROUTINE W REFLEX MICROSCOPIC
Glucose, UA: 100 — AB
Hgb urine dipstick: NEGATIVE
Ketones, ur: NEGATIVE
pH: 6.5

## 2011-06-10 LAB — COMPREHENSIVE METABOLIC PANEL
ALT: 21
AST: 21
CO2: 30
Calcium: 9
Chloride: 99
GFR calc Af Amer: >60
GFR calc non Af Amer: 54 — ABNORMAL LOW
Sodium: 135

## 2011-06-10 LAB — URINE MICROSCOPIC-ADD ON

## 2011-06-10 LAB — DIFFERENTIAL
Eosinophils Absolute: 0
Eosinophils Relative: 0
Lymphs Abs: 1.4
Monocytes Absolute: 0.8 — ABNORMAL HIGH

## 2011-06-10 LAB — CBC
MCHC: 34
RBC: 5.57
WBC: 11.9 — ABNORMAL HIGH

## 2011-06-10 LAB — LIPASE, BLOOD: Lipase: 17

## 2011-08-05 ENCOUNTER — Emergency Department (HOSPITAL_BASED_OUTPATIENT_CLINIC_OR_DEPARTMENT_OTHER)
Admission: EM | Admit: 2011-08-05 | Discharge: 2011-08-05 | Disposition: A | Discharge status: Home / Self Care | Payer: Medicare PPO | Attending: Emergency Medicine | Admitting: Emergency Medicine

## 2011-08-05 ENCOUNTER — Other Ambulatory Visit: Payer: Self-pay

## 2011-08-05 ENCOUNTER — Emergency Department (INDEPENDENT_AMBULATORY_CARE_PROVIDER_SITE_OTHER): Payer: Medicare PPO

## 2011-08-05 DIAGNOSIS — Z8739 Personal history of other diseases of the musculoskeletal system and connective tissue: Secondary | ICD-10-CM | POA: Insufficient documentation

## 2011-08-05 DIAGNOSIS — R079 Chest pain, unspecified: Secondary | ICD-10-CM | POA: Insufficient documentation

## 2011-08-05 DIAGNOSIS — E119 Type 2 diabetes mellitus without complications: Secondary | ICD-10-CM | POA: Insufficient documentation

## 2011-08-05 DIAGNOSIS — I10 Essential (primary) hypertension: Secondary | ICD-10-CM | POA: Insufficient documentation

## 2011-08-05 DIAGNOSIS — R0602 Shortness of breath: Secondary | ICD-10-CM | POA: Insufficient documentation

## 2011-08-05 DIAGNOSIS — E785 Hyperlipidemia, unspecified: Secondary | ICD-10-CM | POA: Insufficient documentation

## 2011-08-05 DIAGNOSIS — R05 Cough: Secondary | ICD-10-CM

## 2011-08-05 DIAGNOSIS — Z8679 Personal history of other diseases of the circulatory system: Secondary | ICD-10-CM | POA: Insufficient documentation

## 2011-08-05 HISTORY — DX: Cerebral infarction, unspecified: I63.9

## 2011-08-05 HISTORY — DX: Hyperlipidemia, unspecified: E78.5

## 2011-08-05 HISTORY — DX: Essential (primary) hypertension: I10

## 2011-08-05 HISTORY — DX: Unspecified osteoarthritis, unspecified site: M19.90

## 2011-08-05 NOTE — ED Provider Notes (Signed)
History     CSN: [id number] Arrival date & time: 08/05/2011  2:02 PM   First MD Initiated Contact with Patient 08/05/11 1511      Chief Complaint  Patient presents with  . Chest Pain  . Cough    (Consider location/radiation/quality/duration/timing/severity/associated sxs/prior treatment) HPI Comments: Pt spoke to the insurance nurse hotline and was told to come to the ED. Pt has felt like he has had a lot of burping and indegestion.  It has lasted about 30 minutes at a time.  Patient is a 70 y.o. male presenting with chest pain and cough. The history is provided by the patient.  Chest Pain The chest pain began 2 days ago. Chest pain occurs intermittently. Associated with: not with coughing or exertion. The severity of the pain is mild. Quality: it feels like it is "weak" The pain does not radiate. Primary symptoms include shortness of breath and cough. Pertinent negatives for primary symptoms include no nausea and no vomiting.  The sputum is white. Past medical history comments: Pt has some sort of heart problem that they wanted to "clean out his heart"    Cough Associated symptoms include chest pain and shortness of breath. Past medical history comments: Pt has some sort of heart problem that they wanted to "clean out his heart".    Past Medical History  Diagnosis Date  . Hypertension   . Diabetes mellitus   . Hyperlipemia   . Arthritis   . Stroke     History reviewed. No pertinent past surgical history.  No family history on file.  History  Substance Use Topics  . Smoking status: Never Smoker   . Smokeless tobacco: Never Used  . Alcohol Use: No      Review of Systems  Respiratory: Positive for cough and shortness of breath.   Cardiovascular: Positive for chest pain.  Gastrointestinal: Negative for nausea and vomiting.  All other systems reviewed and are negative.    Allergies  Penicillins  Home Medications  No current outpatient prescriptions on file.  Pt takes 3 or 4 medications for diabetes, blood pressure and to keep "his valves:" clear  BP 132/93  Pulse 103  Temp(Src) 98.9 F (37.2 C) (Oral)  Resp 18  Ht 5\' 9"  (1.753 m)  Wt 190 lb (86.183 kg)  BMI 28.06 kg/m2  SpO2 96%  Physical Exam  Nursing note and vitals reviewed. Constitutional: He appears well-developed and well-nourished. No distress.  HENT:  Head: Normocephalic and atraumatic.  Right Ear: External ear normal.  Left Ear: External ear normal.  Eyes: Conjunctivae are normal. Right eye exhibits no discharge. Left eye exhibits no discharge. No scleral icterus.  Neck: Neck supple. No tracheal deviation present.  Cardiovascular: Normal rate, regular rhythm and intact distal pulses.   Pulmonary/Chest: Effort normal and breath sounds normal. No stridor. No respiratory distress. He has no wheezes. He has no rales. He exhibits no tenderness.  Abdominal: Soft. Bowel sounds are normal. He exhibits no distension and no mass. There is no tenderness. There is no rebound and no guarding.  Musculoskeletal: He exhibits no edema and no tenderness.  Neurological: He is alert. He has normal strength. No sensory deficit. Cranial nerve deficit:  no gross defecits noted. He exhibits normal muscle tone. He displays no seizure activity. Coordination normal.  Skin: Skin is warm and dry. No rash noted.  Psychiatric: He has a normal mood and affect.    ED Course  Procedures (including critical care time)  Date: 08/05/2011  Rate: 95  Rhythm: normal sinus rhythm  QRS Axis: left  Intervals: normal  ST/T Wave abnormalities: normal  Conduction Disutrbances:right bundle branch block and left anterior fascicular block  Narrative Interpretation:   Old EKG Reviewed: unchanged   Labs Reviewed - No data to display Dg Chest 2 View  08/05/2011  *RADIOLOGY REPORT*  Clinical Data: Chest pain and cough for 2 days.  History hypertension.  Diabetes.  Hyperlipidemia.  CHEST - 2 VIEW  Comparison:  02/10/2010  Findings: The small left pleural effusion has resolved.  Moderate mid thoracic spondylosis.  Midline trachea.  Normal heart size and mediastinal contours for age.  No pneumothorax.  Clear lungs.  IMPRESSION: No acute cardiopulmonary disease.  Resolved small left pleural effusion.  Original Report Authenticated By: Consuello Bossier, M.D.      3:27 PM patient has refused any blood testing. Patient does agree to have an EKG and a chest x-ray. Patient understands that he has heart problems and states he was told a few years ago that he should have heart surgery. Patient states he does not want to have that done. I explained to him that I could not rule out a heart attack or potentially life-threatening illness simply based on the EKG and chest x-ray considering his complaints. The patient and his sister  understand this and still only want the chest x-ray and EKG.  MDM  The EKG does not show an acute ST elevation MI. Chest x-ray does not show any signs to suggest a widened mediastinum, pneumonia or pneumothorax. I did explain to the patient at this point I certainly cannot exclude that he might not be having a heart attack or other calms with his heart. Patient understands this and states he does not want any further testing done. Patient states his overall quality of life is not very good with his medical problems he does not want to have any interventions done on his heart.  Patient has been encouraged to return to emergency room any time if he changes his mind. Your knee has plans for an appointment with his doctor in the next few days.  Diagnosis: Chest pain        Celene Kras, MD 08/05/11 [phone]

## 2011-08-05 NOTE — ED Notes (Signed)
Pt reports intermittent chest pain and cough x 2 days.

## 2012-03-30 ENCOUNTER — Telehealth: Payer: Self-pay | Admitting: Gastroenterology

## 2012-03-30 NOTE — Telephone Encounter (Signed)
Pt has history with Dr Loreta Ave and was advised to call and get an appt with Dr Loreta Ave.  I advised that they have all his records and know his history.  Pt was given the phone number and agreed to call for appt

## 2012-08-18 ENCOUNTER — Emergency Department (HOSPITAL_BASED_OUTPATIENT_CLINIC_OR_DEPARTMENT_OTHER): Payer: Medicare PPO

## 2012-08-18 ENCOUNTER — Emergency Department (HOSPITAL_BASED_OUTPATIENT_CLINIC_OR_DEPARTMENT_OTHER)
Admission: EM | Admit: 2012-08-18 | Discharge: 2012-08-18 | Disposition: A | Discharge status: Home / Self Care | Payer: Medicare PPO | Attending: Emergency Medicine | Admitting: Emergency Medicine

## 2012-08-18 ENCOUNTER — Encounter (HOSPITAL_BASED_OUTPATIENT_CLINIC_OR_DEPARTMENT_OTHER): Payer: Self-pay | Admitting: Emergency Medicine

## 2012-08-18 DIAGNOSIS — E785 Hyperlipidemia, unspecified: Secondary | ICD-10-CM | POA: Insufficient documentation

## 2012-08-18 DIAGNOSIS — Z8739 Personal history of other diseases of the musculoskeletal system and connective tissue: Secondary | ICD-10-CM | POA: Insufficient documentation

## 2012-08-18 DIAGNOSIS — I10 Essential (primary) hypertension: Secondary | ICD-10-CM | POA: Insufficient documentation

## 2012-08-18 DIAGNOSIS — R5381 Other malaise: Secondary | ICD-10-CM | POA: Insufficient documentation

## 2012-08-18 DIAGNOSIS — Z79899 Other long term (current) drug therapy: Secondary | ICD-10-CM | POA: Insufficient documentation

## 2012-08-18 DIAGNOSIS — R51 Headache: Secondary | ICD-10-CM | POA: Insufficient documentation

## 2012-08-18 DIAGNOSIS — E119 Type 2 diabetes mellitus without complications: Secondary | ICD-10-CM | POA: Insufficient documentation

## 2012-08-18 DIAGNOSIS — Z8673 Personal history of transient ischemic attack (TIA), and cerebral infarction without residual deficits: Secondary | ICD-10-CM | POA: Insufficient documentation

## 2012-08-18 DIAGNOSIS — I160 Hypertensive urgency: Secondary | ICD-10-CM

## 2012-08-18 LAB — CBC WITH DIFFERENTIAL/PLATELET
Basophils Absolute: 0.1 10*3/uL (ref 0.0–0.1)
Basophils Relative: 1 % (ref 0–1)
Eosinophils Absolute: 0.2 10*3/uL (ref 0.0–0.7)
Eosinophils Relative: 3 % (ref 0–5)
HCT: 42.1 % (ref 39.0–52.0)
MCH: 31.1 pg (ref 26.0–34.0)
MCHC: 36.1 g/dL — ABNORMAL HIGH (ref 30.0–36.0)
MCV: 86.1 fL (ref 78.0–100.0)
Monocytes Absolute: 0.8 10*3/uL (ref 0.1–1.0)
Neutro Abs: 6.2 10*3/uL (ref 1.7–7.7)
RDW: 12.1 % (ref 11.5–15.5)

## 2012-08-18 LAB — COMPREHENSIVE METABOLIC PANEL
AST: 21 U/L (ref 0–37)
Albumin: 3.6 g/dL (ref 3.5–5.2)
BUN: 25 mg/dL — ABNORMAL HIGH (ref 6–23)
Calcium: 9 mg/dL (ref 8.4–10.5)
Creatinine, Ser: 1.9 mg/dL — ABNORMAL HIGH (ref 0.50–1.35)
Total Protein: 6.8 g/dL (ref 6.0–8.3)

## 2012-08-18 MED ORDER — METOPROLOL TARTRATE 50 MG PO TABS
50.0000 mg | ORAL_TABLET | Freq: Once | ORAL | Status: AC
  Administered 2012-08-18: 50 mg via ORAL
  Filled 2012-08-18: qty 1

## 2012-08-18 NOTE — ED Notes (Signed)
Pt goes to [facility] and had them check his bp, pt reports bp high there so he drove hisself to er

## 2012-08-18 NOTE — ED Provider Notes (Signed)
History     CSN: [id number]  Arrival date & time 08/18/12  1732   First MD Initiated Contact with Patient 08/18/12 1753      Chief Complaint  Patient presents with  . Hypertension    (Consider location/radiation/quality/duration/timing/severity/associated sxs/prior treatment) HPI Comments: Patient "not feeling well today".  Reports headache, fatigue.  Went to fire station by his house for a blood pressure check and it was high.  He comes here for eval of this.  He denies chest pain or shortness of breath.  No fevers or chills.  No cough.  Patient is a 71 y.o. male presenting with hypertension. The history is provided by the patient.  Hypertension This is a new problem. Episode onset: today. The problem occurs constantly. The problem has been gradually worsening. Associated symptoms include headaches. Pertinent negatives include no chest pain and no shortness of breath. Nothing aggravates the symptoms. Nothing relieves the symptoms.    Past Medical History  Diagnosis Date  . Hypertension   . Diabetes mellitus   . Hyperlipemia   . Arthritis   . Stroke     History reviewed. No pertinent past surgical history.  History reviewed. No pertinent family history.  History  Substance Use Topics  . Smoking status: Never Smoker   . Smokeless tobacco: Never Used  . Alcohol Use: No      Review of Systems  Constitutional: Positive for fatigue. Negative for fever.  Respiratory: Negative for shortness of breath.   Cardiovascular: Negative for chest pain.  Neurological: Positive for headaches.  All other systems reviewed and are negative.    Allergies  Penicillins  Home Medications  No current outpatient prescriptions on file.  BP 177/99  Pulse 66  Temp 98.3 F (36.8 C) (Oral)  Resp 18  Ht 5\' 10"  (1.778 m)  Wt 190 lb (86.183 kg)  BMI 27.26 kg/m2  SpO2 100%  Physical Exam  Nursing note and vitals reviewed. Constitutional: He is oriented to person, place, and time.  He appears well-developed and well-nourished. No distress.  HENT:  Head: Normocephalic and atraumatic.  Mouth/Throat: Oropharynx is clear and moist.  Eyes: EOM are normal. Pupils are equal, round, and reactive to light.  Neck: Normal range of motion. Neck supple.  Cardiovascular: Normal rate and regular rhythm.   No murmur heard. Pulmonary/Chest: Effort normal and breath sounds normal. No respiratory distress.  Abdominal: Soft. Bowel sounds are normal. He exhibits no distension.  Musculoskeletal: Normal range of motion. He exhibits no edema.  Neurological: He is alert and oriented to person, place, and time. No cranial nerve deficit. He exhibits normal muscle tone. Coordination normal.  Skin: Skin is warm and dry. He is not diaphoretic.    ED Course  Procedures (including critical care time)   Labs Reviewed  CBC WITH DIFFERENTIAL  COMPREHENSIVE METABOLIC PANEL   No results found.   No diagnosis found.   Date: 08/18/2012  Rate: 67  Rhythm: normal sinus rhythm  QRS Axis: left  Intervals: normal  ST/T Wave abnormalities: normal  Conduction Disutrbances:right bundle branch block  Narrative Interpretation:   Old EKG Reviewed: unchanged    MDM  The patient presents with elevated bp, not feeling well.  His blood pressure is improved with meds given and he is feeling better.  He is on several medications and he cannot remember the name or reason for any of them.  He attempted to call his house but his mother did not answer.  He appears stable for discharge, is  to follow up with the physician that prescribes his medications.  Return prn.        Geoffery Lyons, MD 08/18/12 2040

## 2012-08-18 NOTE — ED Notes (Signed)
Pt unaware of name of bp medication that he takes, currently attempting to get in touch with someone at home who can tell us name of medications

## 2012-09-08 ENCOUNTER — Emergency Department (HOSPITAL_COMMUNITY)
Admission: EM | Admit: 2012-09-08 | Discharge: 2012-09-08 | Disposition: A | Discharge status: Home / Self Care | Payer: Medicare PPO | Attending: Emergency Medicine | Admitting: Emergency Medicine

## 2012-09-08 ENCOUNTER — Encounter (HOSPITAL_COMMUNITY): Payer: Self-pay | Admitting: Emergency Medicine

## 2012-09-08 DIAGNOSIS — Z8739 Personal history of other diseases of the musculoskeletal system and connective tissue: Secondary | ICD-10-CM | POA: Insufficient documentation

## 2012-09-08 DIAGNOSIS — E119 Type 2 diabetes mellitus without complications: Secondary | ICD-10-CM | POA: Insufficient documentation

## 2012-09-08 DIAGNOSIS — Z8673 Personal history of transient ischemic attack (TIA), and cerebral infarction without residual deficits: Secondary | ICD-10-CM | POA: Insufficient documentation

## 2012-09-08 DIAGNOSIS — E785 Hyperlipidemia, unspecified: Secondary | ICD-10-CM | POA: Insufficient documentation

## 2012-09-08 DIAGNOSIS — Z79899 Other long term (current) drug therapy: Secondary | ICD-10-CM | POA: Insufficient documentation

## 2012-09-08 DIAGNOSIS — I10 Essential (primary) hypertension: Secondary | ICD-10-CM

## 2012-09-08 DIAGNOSIS — R51 Headache: Secondary | ICD-10-CM | POA: Insufficient documentation

## 2012-09-08 LAB — POCT I-STAT, CHEM 8
Chloride: 103 mEq/L (ref 96–112)
Creatinine, Ser: 1.8 mg/dL — ABNORMAL HIGH (ref 0.50–1.35)
Glucose, Bld: 114 mg/dL — ABNORMAL HIGH (ref 70–99)
HCT: 47 % (ref 39.0–52.0)
Potassium: 3.8 mEq/L (ref 3.5–5.1)
Sodium: 139 mEq/L (ref 135–145)

## 2012-09-08 MED ORDER — LISINOPRIL 40 MG PO TABS: 40.0000 mg | ORAL_TABLET | Freq: Every day | ORAL | Status: DC

## 2012-09-08 MED ORDER — PANTOPRAZOLE SODIUM 40 MG PO TBEC: 40.0000 mg | DELAYED_RELEASE_TABLET | Freq: Every day | ORAL | Status: DC

## 2012-09-08 MED ORDER — AMLODIPINE BESYLATE 10 MG PO TABS: 10.0000 mg | ORAL_TABLET | Freq: Every day | ORAL | Status: DC

## 2012-09-08 NOTE — ED Provider Notes (Signed)
History     CSN: [id number]  Arrival date & time 09/08/12  1304   First MD Initiated Contact with Patient 09/08/12 1431      Chief Complaint  Patient presents with  . Hypertension  . Headache    (Consider location/radiation/quality/duration/timing/severity/associated sxs/prior treatment) HPI Pt with history of HTN unsure of all of his medications reports he has been out of his lisinopril for the last few days. He has had increased BP at home. Complaining of moderate diffuse headache, but denies any CP, SOB.   Past Medical History  Diagnosis Date  . Hypertension   . Diabetes mellitus   . Hyperlipemia   . Arthritis   . Stroke     History reviewed. No pertinent past surgical history.  History reviewed. No pertinent family history.  History  Substance Use Topics  . Smoking status: Never Smoker   . Smokeless tobacco: Never Used  . Alcohol Use: No      Review of Systems All other systems reviewed and are negative except as noted in HPI.   Allergies  Penicillins  Home Medications   Current Outpatient Rx  Name  Route  Sig  Dispense  Refill  . GLIPIZIDE 5 MG PO TABS   Oral   Take 5 mg by mouth daily.         Marland Kitchen LISINOPRIL 40 MG PO TABS   Oral   Take 40 mg by mouth daily.         Marland Kitchen PANTOPRAZOLE SODIUM 40 MG PO TBEC   Oral   Take 40 mg by mouth daily.           BP 176/108  Pulse 102  Temp 98 F (36.7 C) (Oral)  Resp 18  SpO2 96%  Physical Exam  Nursing note and vitals reviewed. Constitutional: He is oriented to person, place, and time. He appears well-developed and well-nourished.  HENT:  Head: Normocephalic and atraumatic.  Eyes: EOM are normal. Pupils are equal, round, and reactive to light.  Neck: Normal range of motion. Neck supple.  Cardiovascular: Normal rate, normal heart sounds and intact distal pulses.   Pulmonary/Chest: Effort normal and breath sounds normal.  Abdominal: Bowel sounds are normal. He exhibits no distension. There is  no tenderness.  Musculoskeletal: Normal range of motion. He exhibits no edema and no tenderness.  Neurological: He is alert and oriented to person, place, and time. He has normal strength. No cranial nerve deficit or sensory deficit.  Skin: Skin is warm and dry. No rash noted.  Psychiatric: He has a normal mood and affect.    ED Course  Procedures (including critical care time)  Labs Reviewed  POCT I-STAT, CHEM 8 - Abnormal; Notable for the following:    BUN 28 (*)     Creatinine, Ser 1.80 (*)     Glucose, Bld 114 (*)     All other components within normal limits   No results found.   No diagnosis found.    MDM  Creatinine at baseline. No evidence of acute end organ damage. Review of available records from [facility] reveals HCTZ stopped due to renal function. BP not well controlled on single agent lisinopril. Plan addition of Norvasc, advised PCP followup.         Charles B. Bernette Mayers, MD 09/08/12 1547

## 2012-09-08 NOTE — ED Notes (Signed)
Pt c/o htn x 8 months since being constipated; pt sts HA yesterday and took BP today and was high

## 2012-09-08 NOTE — ED Notes (Signed)
Patient states he took someone he knows prescribes pill apprx 8 months ago and he states since that time he has not been able to urinate without straining and does not empty his bladder. Patient is unable to tell me the name of the medication or what is was prescribed for.

## 2015-01-02 ENCOUNTER — Ambulatory Visit (INDEPENDENT_AMBULATORY_CARE_PROVIDER_SITE_OTHER): Payer: Commercial Managed Care - HMO | Admitting: Family Medicine

## 2015-01-02 VITALS — BP 132/74 | HR 78 | Temp 98.2°F | Resp 16 | Ht 68.5 in | Wt 170.2 lb

## 2015-01-02 DIAGNOSIS — C44311 Basal cell carcinoma of skin of nose: Secondary | ICD-10-CM | POA: Diagnosis not present

## 2015-01-02 NOTE — Progress Notes (Signed)
   Subjective:    Patient ID: [name], male    DOB: 12-18-1940, 74 y.o.   MRN: [id number]  HPI Patient presents for spot on nose that has been there for 6 months. Area has grown in size. Keeps scabbing over then healing before getting worse. Started as small bump. Has been reddish black for 6 months. Is not painful and does not itch. Denies trauma or being on blood thinners. Has not happened before. Has put skin salve on nose without any improvement.  Med allergies: PCN.   Review of Systems  Constitutional: Negative for fever, chills, diaphoresis, appetite change and unexpected weight change.  HENT: Negative for congestion.   Skin: Positive for color change. Negative for pallor, rash and wound.       Spreading scab on nose       Objective:   Physical Exam  Constitutional: He appears well-developed and well-nourished. No distress.  Blood pressure 132/74, pulse 78, temperature 98.2 F (36.8 C), temperature source Oral, resp. rate 16, height 5' 8.5" (1.74 m), weight 170 lb 4 oz (77.225 kg), SpO2 95 %.  HENT:  Head: Normocephalic and atraumatic.  Left Ear: External ear normal.  Eyes: Conjunctivae are normal. Right eye exhibits no discharge. Left eye exhibits no discharge. No scleral icterus.  Neck: Neck supple. No thyromegaly present.  Lymphadenopathy:    He has no cervical adenopathy.  Neurological: He is alert.  Skin: Skin is warm and dry. No rash noted. He is not diaphoretic. No erythema. No pallor.  Basal cell carcinoma on left side of nose with small area of telangectasia       Assessment & Plan:  1. Basal cell carcinoma of nose Discontinue use of salve. - Ambulatory referral to Dermatology   Alveta Heimlich PA-C  [facility] 01/02/2015 5:29 PM

## 2015-01-02 NOTE — Patient Instructions (Signed)
Basal Cell Carcinoma Basal cell carcinoma is the most common form of skin cancer. It begins in the basal cells, which are at the bottom of the outer skin layer (epidermis). CAUSES  Sun exposure is the most common cause of basal cell carcinoma. Basal cell carcinoma occurs most often on parts of the body that are frequently exposed to the sun, including the:  Scalp.  Ears.  Neck.  Face.  Arms.  Backs of the hands.  Legs. However, basal cell carcinoma can occur anywhere on the body. Rarely, tumors develop on areas not exposed to the sun. Other causes of basal cell carcinoma can include:  Exposure to arsenic.  Exposure to radiation.  Certain genetic syndromes, such as xeroderma pigmentosum. RISK FACTORS People at highest risk for basal cell carcinoma include those with:  Fair skin.  Blonde or red hair.  Blue, green, or gray eyes.  Childhood freckling. Factors that increase your risk for basal cell carcinoma include:  Sun exposure over long periods of time. Childhood sun exposure appears to be a more significant factor than sun exposure as an adult.  Repeated sunburns.  Use of tanning beds.  Having a weakened immune system. SYMPTOMS Five signs of basal cell carcinoma are:  An open sore that bleeds, oozes, or crusts. The sore may remain open for 3 or more weeks. This can be an early sign of basal cell carcinoma. Basal cell carcinoma can mimic a pimple that will not heal.  A reddish or irritated area which may crust, itch, or cause discomfort. This may occur on areas expose d to the sun. These patches might be easier felt than seen.  A shiny, pearly, or translucent bump that is pink, red, or white. The bump may also be tan, black, or brown, especially in dark haired people. These bumps can be confused with moles.  A pink growth with a slightly elevated, rolled border, and a crusted indentation in the center. As the growth slowly enlarges, tiny blood vessels may develop  on the surface.  A scar-like white, yellow, or waxy area that looks like shiny, stretched skin. It often has irregular borders. This may be a sign of more aggressive basal cell carcinoma. DIAGNOSIS  Your caregiver may be able to tell what is wrong by doing a physical exam. Often, a tissue sample (biopsy) is also taken. The tissue is examined under a microscope.  TREATMENT  The treatment for basal cell carcinoma depends on the type, size, location, and number of tumors. Possible treatments include:   Mohs surgery. This is a procedure done by a skin doctor (dermatologist or Mohs surgeon) in his or her office. The cancerous cells are removed layer by layer. This treatment has a high cure rate.  Surgical removal of the tumor.  Freezing the tumor with liquid nitrogen (cryosurgery).  Plastic surgery to remove the tumor, in the case of large tumors.  Radiation. This may be used for tumors on the face.  Photodynamic therapy. A chemical cream is applied to the skin and light exposure is used to activate the chemical.  Chemical treatments, such as imiquimod cream and interferon injections. This may be used to remove superficial tumors with minimal scarring.  Electrodesiccation and curettage. This involves alternately scraping and burning the tumor, using an electric current to control bleeding. Basal cell carcinoma can almost always be cured. It rarely spreads to other areas of the body (metastasizes). Basal cell carcinoma may come back at the same location (recur), but it can be treated   again if this occurs. PREVENTION  Avoid the sun between 10:00 a.m. and 4:00 pm when it is the strongest.  Use a sunscreen or sunblock with a sun protection factor of 30 or greater.  Apply sunscreen at least 30 minutes before exposure to the sun.  Reapply sunscreen every 2 to 4 hours while you are outside, after swimming, and after excessive sweating.  Always wear protective hats, clothing, and sunglasses with  ultraviolet protection.  Avoid tanning beds. HOME CARE INSTRUCTIONS   Avoid unprotected sun exposure.  Follow your caregiver's instructions for self-exams. Look for new spots or changes in your skin.  Keep all follow-up appointments as directed by your caregiver. SEEK MEDICAL CARE IF:   You notice any new spots or changes in your skin.  You have had a basal cell carcinoma tumor removed and you notice a new growth in the same location. Document Released: 02/15/2003 Document Revised: 02/10/2012 Document Reviewed: 05/05/2011 Exit[employer] Patient Information 2015 [employer]. This information is not intended to replace advice given to you by your health care provider. Make sure you discuss any questions you have with your health care provider.  

## 2015-02-21 ENCOUNTER — Other Ambulatory Visit: Payer: Self-pay | Admitting: Dermatology

## 2015-07-28 ENCOUNTER — Emergency Department (HOSPITAL_COMMUNITY): Payer: Commercial Managed Care - HMO

## 2015-07-28 ENCOUNTER — Inpatient Hospital Stay (HOSPITAL_COMMUNITY): Payer: Commercial Managed Care - HMO

## 2015-07-28 ENCOUNTER — Encounter (HOSPITAL_COMMUNITY): Payer: Self-pay | Admitting: Emergency Medicine

## 2015-07-28 ENCOUNTER — Other Ambulatory Visit: Payer: Self-pay

## 2015-07-28 ENCOUNTER — Inpatient Hospital Stay (HOSPITAL_COMMUNITY)
Admission: EM | Admit: 2015-07-28 | Discharge: 2015-07-29 | DRG: 683 | Disposition: A | Discharge status: Home / Self Care | Payer: Commercial Managed Care - HMO | Attending: Internal Medicine | Admitting: Internal Medicine

## 2015-07-28 DIAGNOSIS — K5900 Constipation, unspecified: Secondary | ICD-10-CM | POA: Diagnosis present

## 2015-07-28 DIAGNOSIS — D509 Iron deficiency anemia, unspecified: Secondary | ICD-10-CM | POA: Diagnosis present

## 2015-07-28 DIAGNOSIS — R079 Chest pain, unspecified: Secondary | ICD-10-CM

## 2015-07-28 DIAGNOSIS — D638 Anemia in other chronic diseases classified elsewhere: Secondary | ICD-10-CM | POA: Diagnosis present

## 2015-07-28 DIAGNOSIS — N179 Acute kidney failure, unspecified: Principal | ICD-10-CM

## 2015-07-28 DIAGNOSIS — N138 Other obstructive and reflux uropathy: Secondary | ICD-10-CM | POA: Diagnosis present

## 2015-07-28 DIAGNOSIS — I129 Hypertensive chronic kidney disease with stage 1 through stage 4 chronic kidney disease, or unspecified chronic kidney disease: Secondary | ICD-10-CM | POA: Diagnosis present

## 2015-07-28 DIAGNOSIS — N19 Unspecified kidney failure: Secondary | ICD-10-CM

## 2015-07-28 DIAGNOSIS — N189 Chronic kidney disease, unspecified: Secondary | ICD-10-CM | POA: Diagnosis present

## 2015-07-28 DIAGNOSIS — N390 Urinary tract infection, site not specified: Secondary | ICD-10-CM

## 2015-07-28 DIAGNOSIS — Z880 Allergy status to penicillin: Secondary | ICD-10-CM

## 2015-07-28 DIAGNOSIS — I10 Essential (primary) hypertension: Secondary | ICD-10-CM | POA: Diagnosis present

## 2015-07-28 DIAGNOSIS — Z8673 Personal history of transient ischemic attack (TIA), and cerebral infarction without residual deficits: Secondary | ICD-10-CM

## 2015-07-28 DIAGNOSIS — C61 Malignant neoplasm of prostate: Secondary | ICD-10-CM | POA: Diagnosis present

## 2015-07-28 DIAGNOSIS — D649 Anemia, unspecified: Secondary | ICD-10-CM

## 2015-07-28 DIAGNOSIS — G3184 Mild cognitive impairment, so stated: Secondary | ICD-10-CM | POA: Diagnosis present

## 2015-07-28 DIAGNOSIS — E1122 Type 2 diabetes mellitus with diabetic chronic kidney disease: Secondary | ICD-10-CM | POA: Diagnosis present

## 2015-07-28 DIAGNOSIS — E785 Hyperlipidemia, unspecified: Secondary | ICD-10-CM | POA: Diagnosis present

## 2015-07-28 DIAGNOSIS — M1990 Unspecified osteoarthritis, unspecified site: Secondary | ICD-10-CM | POA: Diagnosis present

## 2015-07-28 DIAGNOSIS — J029 Acute pharyngitis, unspecified: Secondary | ICD-10-CM | POA: Diagnosis present

## 2015-07-28 DIAGNOSIS — E875 Hyperkalemia: Secondary | ICD-10-CM | POA: Diagnosis present

## 2015-07-28 DIAGNOSIS — E119 Type 2 diabetes mellitus without complications: Secondary | ICD-10-CM

## 2015-07-28 LAB — BASIC METABOLIC PANEL
Anion gap: 13 (ref 5–15)
BUN: 63 mg/dL — AB (ref 6–20)
CHLORIDE: 102 mmol/L (ref 101–111)
CO2: 19 mmol/L — AB (ref 22–32)
CREATININE: 8.7 mg/dL — AB (ref 0.61–1.24)
Calcium: 7.4 mg/dL — ABNORMAL LOW (ref 8.9–10.3)
GFR calc Af Amer: 6 mL/min — ABNORMAL LOW (ref >60)
GFR calc non Af Amer: 5 mL/min — ABNORMAL LOW (ref >60)
GLUCOSE: 188 mg/dL — AB (ref 65–99)
POTASSIUM: 5.2 mmol/L — AB (ref 3.5–5.1)
SODIUM: 134 mmol/L — AB (ref 135–145)

## 2015-07-28 LAB — FOLATE: Folate: 13.9 ng/mL (ref >5.9)

## 2015-07-28 LAB — CBC
HEMATOCRIT: 25 % — AB (ref 39.0–52.0)
Hemoglobin: 8.4 g/dL — ABNORMAL LOW (ref 13.0–17.0)
MCH: 30.4 pg (ref 26.0–34.0)
MCHC: 33.6 g/dL (ref 30.0–36.0)
MCV: 90.6 fL (ref 78.0–100.0)
PLATELETS: 227 10*3/uL (ref 150–400)
RBC: 2.76 MIL/uL — ABNORMAL LOW (ref 4.22–5.81)
RDW: 12.7 % (ref 11.5–15.5)
WBC: 13.4 10*3/uL — ABNORMAL HIGH (ref 4.0–10.5)

## 2015-07-28 LAB — URINALYSIS, ROUTINE W REFLEX MICROSCOPIC
BILIRUBIN URINE: NEGATIVE
GLUCOSE, UA: 250 mg/dL — AB
Ketones, ur: NEGATIVE mg/dL
Nitrite: NEGATIVE
PH: 6 (ref 5.0–8.0)
Protein, ur: 100 mg/dL — AB
SPECIFIC GRAVITY, URINE: 1.02 (ref 1.005–1.030)

## 2015-07-28 LAB — URINE MICROSCOPIC-ADD ON: Bacteria, UA: NONE SEEN

## 2015-07-28 LAB — I-STAT TROPONIN, ED: Troponin i, poc: 0.01 ng/mL (ref 0.00–0.08)

## 2015-07-28 LAB — GLUCOSE, CAPILLARY
GLUCOSE-CAPILLARY: 140 mg/dL — AB (ref 65–99)
Glucose-Capillary: 120 mg/dL — ABNORMAL HIGH (ref 65–99)
Glucose-Capillary: 156 mg/dL — ABNORMAL HIGH (ref 65–99)

## 2015-07-28 LAB — RETICULOCYTES
RBC.: 2.66 MIL/uL — ABNORMAL LOW (ref 4.22–5.81)
RETIC CT PCT: 1.1 % (ref 0.4–3.1)
Retic Count, Absolute: 29.3 10*3/uL (ref 19.0–186.0)

## 2015-07-28 LAB — LIPASE, BLOOD: Lipase: 25 U/L (ref 11–51)

## 2015-07-28 LAB — CK: CK TOTAL: 167 U/L (ref 49–397)

## 2015-07-28 LAB — PSA: PSA: 31.94 ng/mL — ABNORMAL HIGH (ref 0.00–4.00)

## 2015-07-28 LAB — IRON AND TIBC
Iron: 17 ug/dL — ABNORMAL LOW (ref 45–182)
Saturation Ratios: 10 % — ABNORMAL LOW (ref 17.9–39.5)
TIBC: 164 ug/dL — AB (ref 250–450)
UIBC: 147 ug/dL

## 2015-07-28 LAB — FERRITIN: FERRITIN: 502 ng/mL — AB (ref 24–336)

## 2015-07-28 LAB — HEPATIC FUNCTION PANEL
ALK PHOS: 63 U/L (ref 38–126)
ALT: 9 U/L — AB (ref 17–63)
AST: 13 U/L — AB (ref 15–41)
Albumin: 3.2 g/dL — ABNORMAL LOW (ref 3.5–5.0)
BILIRUBIN DIRECT: 0.2 mg/dL (ref 0.1–0.5)
Indirect Bilirubin: 0.8 mg/dL (ref 0.3–0.9)
Total Bilirubin: 1 mg/dL (ref 0.3–1.2)
Total Protein: 6.9 g/dL (ref 6.5–8.1)

## 2015-07-28 LAB — CREATININE, URINE, RANDOM: CREATININE, URINE: 46.88 mg/dL

## 2015-07-28 LAB — OSMOLALITY, URINE: OSMOLALITY UR: 353 mosm/kg (ref 300–900)

## 2015-07-28 LAB — TSH: TSH: 2.458 u[IU]/mL (ref 0.350–4.500)

## 2015-07-28 LAB — VITAMIN B12: Vitamin B-12: 243 pg/mL (ref 180–914)

## 2015-07-28 LAB — SODIUM, URINE, RANDOM: SODIUM UR: 106 mmol/L

## 2015-07-28 LAB — RAPID STREP SCREEN (MED CTR MEBANE ONLY): STREPTOCOCCUS, GROUP A SCREEN (DIRECT): NEGATIVE

## 2015-07-28 MED ORDER — PANTOPRAZOLE SODIUM 40 MG PO TBEC
40.0000 mg | DELAYED_RELEASE_TABLET | Freq: Every day | ORAL | Status: DC
  Administered 2015-07-29: 40 mg via ORAL
  Filled 2015-07-28: qty 1

## 2015-07-28 MED ORDER — SODIUM CHLORIDE 0.9 % IV SOLN
INTRAVENOUS | Status: DC
  Administered 2015-07-28: 15:00:00 via INTRAVENOUS

## 2015-07-28 MED ORDER — HEPARIN SODIUM (PORCINE) 5000 UNIT/ML IJ SOLN
5000.0000 [IU] | Freq: Three times a day (TID) | INTRAMUSCULAR | Status: DC
  Administered 2015-07-28 – 2015-07-29 (×3): 5000 [IU] via SUBCUTANEOUS
  Filled 2015-07-28 (×2): qty 1

## 2015-07-28 MED ORDER — SODIUM CHLORIDE 0.9 % IV BOLUS (SEPSIS)
500.0000 mL | Freq: Once | INTRAVENOUS | Status: AC
  Administered 2015-07-28: 500 mL via INTRAVENOUS

## 2015-07-28 MED ORDER — INSULIN ASPART 100 UNIT/ML ~~LOC~~ SOLN
0.0000 [IU] | Freq: Three times a day (TID) | SUBCUTANEOUS | Status: DC
  Administered 2015-07-28: 2 [IU] via SUBCUTANEOUS
  Administered 2015-07-29: 1 [IU] via SUBCUTANEOUS

## 2015-07-28 MED ORDER — AMLODIPINE BESYLATE 10 MG PO TABS
10.0000 mg | ORAL_TABLET | Freq: Every day | ORAL | Status: DC
  Administered 2015-07-29: 10 mg via ORAL
  Filled 2015-07-28: qty 1

## 2015-07-28 MED ORDER — INSULIN ASPART 100 UNIT/ML ~~LOC~~ SOLN: 0.0000 [IU] | Freq: Every day | SUBCUTANEOUS | Status: DC

## 2015-07-28 MED ORDER — SODIUM CHLORIDE 0.9 % IJ SOLN
3.0000 mL | Freq: Two times a day (BID) | INTRAMUSCULAR | Status: DC
Start: 2015-07-28 — End: 2015-07-29
  Administered 2015-07-28 – 2015-07-29 (×3): 3 mL via INTRAVENOUS

## 2015-07-28 MED ORDER — POLYETHYLENE GLYCOL 3350 17 G PO PACK
17.0000 g | PACK | Freq: Every day | ORAL | Status: DC
  Administered 2015-07-28 – 2015-07-29 (×2): 17 g via ORAL
  Filled 2015-07-28: qty 1

## 2015-07-28 MED ORDER — LEVOFLOXACIN IN D5W 500 MG/100ML IV SOLN
500.0000 mg | Freq: Once | INTRAVENOUS | Status: DC
  Administered 2015-07-28: 500 mg via INTRAVENOUS
  Filled 2015-07-28: qty 100

## 2015-07-28 MED ORDER — TAMSULOSIN HCL 0.4 MG PO CAPS
0.4000 mg | ORAL_CAPSULE | Freq: Every day | ORAL | Status: DC
  Administered 2015-07-28 – 2015-07-29 (×2): 0.4 mg via ORAL
  Filled 2015-07-28 (×2): qty 1

## 2015-07-28 MED ORDER — SODIUM CHLORIDE 0.9 % IV SOLN
INTRAVENOUS | Status: DC
  Administered 2015-07-28 – 2015-07-29 (×2): via INTRAVENOUS

## 2015-07-28 NOTE — ED Notes (Signed)
LAB NEEDS MORE URINE TO COMPLETED ADDED URINE STUDIES.

## 2015-07-28 NOTE — ED Provider Notes (Signed)
CSN: [id number]     Arrival date & time 07/28/15  0818 History   First MD Initiated Contact with Patient 07/28/15 [phone]     Chief Complaint  Patient presents with  . Sore Throat  . Chest Pain     (Consider location/radiation/quality/duration/timing/severity/associated sxs/prior Treatment) HPI Patient reports that he has not been feeling well this week. He reports he has generally felt fatigued and has been lying about his house. No documented fever. Some nausea. Patient reports over the past 2 days he's been having some chest discomfort. He notices on the left side a few pains that of been coming and going. He reports that they're gone now. He also reports that after he had the pains he noticed that it felt like his throat was sore. He reports it does hurt to swallow. He is not having any difficulty breathing. Patient worse he has some chronic shortness of breath but has felt more short of breath with exertion. No vomiting. Patient reports frequent constipation. He does however note that he had a normal bowel movement more recently. He does state that 2 years ago he was told that he needed an operation on his heart. He reports that he thought it over very carefully and decided against doing that. He reports that hospice had come for a while and that he is no longer in hospice care. He states that he is "not a healthy person". He states that he never thought he would actually live this long. Past Medical History  Diagnosis Date  . Hypertension   . Diabetes mellitus   . Hyperlipemia   . Arthritis   . Stroke [facility])    History reviewed. No pertinent past surgical history. No family history on file. Social History  Substance Use Topics  . Smoking status: Never Smoker   . Smokeless tobacco: Never Used  . Alcohol Use: No    Review of Systems 10 Systems reviewed and are negative for acute change except as noted in the HPI.    Allergies  Penicillins  Home Medications   Prior to  Admission medications   Medication Sig Start Date End Date Taking? Authorizing Provider  amLODipine (NORVASC) 10 MG tablet Take 1 tablet (10 mg total) by mouth daily. 09/08/12  Yes Calvert Cantor, MD  glipiZIDE (GLUCOTROL) 5 MG tablet Take 5 mg by mouth daily.   Yes Historical Provider, MD  lisinopril (PRINIVIL,ZESTRIL) 20 MG tablet Take 1 tablet by mouth daily. 05/30/15  Yes Historical Provider, MD  nitroGLYCERIN (NITROSTAT) 0.4 MG SL tablet Place 0.4 mg under the tongue every 5 (five) minutes as needed for chest pain.  05/30/15  Yes Historical Provider, MD  pantoprazole (PROTONIX) 40 MG tablet Take 1 tablet (40 mg total) by mouth daily. 09/08/12  Yes Calvert Cantor, MD   BP 132/110 mmHg  Pulse 86  Temp(Src) 98.3 F (36.8 C)  Resp 16  Ht 5\' 9"  (1.753 m)  Wt 180 lb 5 oz (81.789 kg)  BMI 26.62 kg/m2  SpO2 100% Physical Exam  Constitutional: He is oriented to person, place, and time.  Patient is a deconditioned elderly male. He is nontoxic and alert. No respiratory distress. Mental status is clear.  HENT:  Head: Normocephalic and atraumatic.  Nose: Nose normal.  Mouth/Throat: Oropharynx is clear and moist.  Eyes: EOM are normal. Pupils are equal, round, and reactive to light.  Neck: Neck supple.  Cardiovascular: Normal rate, regular rhythm, normal heart sounds and intact distal pulses.   Pulmonary/Chest: Effort normal and  breath sounds normal.  Abdominal: Soft. Bowel sounds are normal. He exhibits no distension. There is no tenderness.  Musculoskeletal: Normal range of motion. He exhibits edema.  Trace edema at the ankles. Calves are soft and nontender. Patient has thickened and long toenails but no areas of wounds or cellulitis.  Neurological: He is alert and oriented to person, place, and time. He has normal strength. Coordination normal. GCS eye subscore is 4. GCS verbal subscore is 5. GCS motor subscore is 6.  Skin: Skin is warm, dry and intact.  Psychiatric: He has a normal mood and  affect.    ED Course  Procedures (including critical care time) Labs Review Labs Reviewed  BASIC METABOLIC PANEL - Abnormal; Notable for the following:    Sodium 134 (*)    Potassium 5.2 (*)    CO2 19 (*)    Glucose, Bld 188 (*)    BUN 63 (*)    Creatinine, Ser 8.70 (*)    Calcium 7.4 (*)    GFR calc non Af Amer 5 (*)    GFR calc Af Amer 6 (*)    All other components within normal limits  CBC - Abnormal; Notable for the following:    WBC 13.4 (*)    RBC 2.76 (*)    Hemoglobin 8.4 (*)    HCT 25.0 (*)    All other components within normal limits  URINALYSIS, ROUTINE W REFLEX MICROSCOPIC (NOT AT [facility]) - Abnormal; Notable for the following:    Glucose, UA 250 (*)    Hgb urine dipstick MODERATE (*)    Protein, ur 100 (*)    Leukocytes, UA MODERATE (*)    All other components within normal limits  URINE MICROSCOPIC-ADD ON - Abnormal; Notable for the following:    Squamous Epithelial / LPF 0-5 (*)    All other components within normal limits  RAPID STREP SCREEN (NOT AT [facility])  CULTURE, GROUP A STREP  URINE CULTURE  CULTURE, BLOOD (ROUTINE X 2)  CULTURE, BLOOD (ROUTINE X 2)  HEPATIC FUNCTION PANEL  LIPASE, BLOOD  I-STAT TROPOININ, ED    Imaging Review Dg Chest 2 View  07/28/2015  CLINICAL DATA:  Chest pain 3-4 days ago EXAM: CHEST  2 VIEW COMPARISON:  08/05/2011 FINDINGS: Borderline cardiomegaly. Low volumes. Bibasilar atelectasis. Upper lungs clear. No pneumothorax. IMPRESSION: Bibasilar atelectasis. Electronically Signed   By: Marybelle Killings M.D.   On: 07/28/2015 09:12   I have personally reviewed and evaluated these images and lab results as part of my medical decision-making.   EKG Interpretation   Date/Time:  Saturday July 28 2015 08:27:37 EST Ventricular Rate:  101 PR Interval:  142 QRS Duration: 116 QT Interval:  368 QTC Calculation: 477 R Axis:   -30 Text Interpretation:  Sinus tachycardia Left axis deviation Right bundle  branch block Abnormal ECG agree.  no sig change from old Confirmed by  Johnney Killian, MD, Jeannie Done [phone]) on 07/28/2015 8:33:10 AM      MDM   Final diagnoses:  Acute renal failure, unspecified acute renal failure type ([place])  Chest pain, unspecified chest pain type  Hyperkalemia  UTI (lower urinary tract infection)  Anemia, unspecified anemia type   Patient presents with complaint of generalized weakness. He describes malaise for several days or maybe even a week. Patient also describes chest pain for 3-4 days. At this point primary finding is for acute renal failure. UA is mildly positive for UTI which will be treated. He does not describe urinary retention and  has had output in the emergency department. He does not endorse suprapubic tenderness or pain. Bladder scan is pending at this time. EKG is not changed from previous. Cardiac enzymes are not elevated. Currently there is no indication of an acute ischemic event. Checks x-ray does not show focal pneumonia. A shoulder admitted for ongoing management of acute renal failure with UTI.    Charlesetta Shanks, MD 07/28/15 [phone]

## 2015-07-28 NOTE — ED Notes (Signed)
Pt. Stated, i started having chest pain about 3-4 days ago and it went away 1-2 days ago and then I started having a sore throat.
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
## 2015-07-28 NOTE — ED Notes (Signed)
Admitting at bedside 

## 2015-07-29 DIAGNOSIS — D649 Anemia, unspecified: Secondary | ICD-10-CM | POA: Insufficient documentation

## 2015-07-29 DIAGNOSIS — E875 Hyperkalemia: Secondary | ICD-10-CM | POA: Insufficient documentation

## 2015-07-29 DIAGNOSIS — N189 Chronic kidney disease, unspecified: Secondary | ICD-10-CM

## 2015-07-29 DIAGNOSIS — C61 Malignant neoplasm of prostate: Secondary | ICD-10-CM

## 2015-07-29 LAB — COMPREHENSIVE METABOLIC PANEL
ALK PHOS: 48 U/L (ref 38–126)
ALT: 8 U/L — AB (ref 17–63)
AST: 11 U/L — AB (ref 15–41)
Albumin: 2.9 g/dL — ABNORMAL LOW (ref 3.5–5.0)
Anion gap: 11 (ref 5–15)
BUN: 65 mg/dL — AB (ref 6–20)
CALCIUM: 7.4 mg/dL — AB (ref 8.9–10.3)
CHLORIDE: 106 mmol/L (ref 101–111)
CO2: 18 mmol/L — ABNORMAL LOW (ref 22–32)
CREATININE: 8.8 mg/dL — AB (ref 0.61–1.24)
GFR calc Af Amer: 6 mL/min — ABNORMAL LOW (ref >60)
GFR, EST NON AFRICAN AMERICAN: 5 mL/min — AB (ref >60)
Glucose, Bld: 128 mg/dL — ABNORMAL HIGH (ref 65–99)
Potassium: 4.6 mmol/L (ref 3.5–5.1)
Sodium: 135 mmol/L (ref 135–145)
Total Bilirubin: 1 mg/dL (ref 0.3–1.2)
Total Protein: 6 g/dL — ABNORMAL LOW (ref 6.5–8.1)

## 2015-07-29 LAB — SAVE SMEAR

## 2015-07-29 LAB — URINE CULTURE: Culture: NO GROWTH

## 2015-07-29 LAB — CBC
HCT: 21.4 % — ABNORMAL LOW (ref 39.0–52.0)
Hemoglobin: 7.2 g/dL — ABNORMAL LOW (ref 13.0–17.0)
MCH: 30.4 pg (ref 26.0–34.0)
MCHC: 33.6 g/dL (ref 30.0–36.0)
MCV: 90.3 fL (ref 78.0–100.0)
PLATELETS: 197 10*3/uL (ref 150–400)
RBC: 2.37 MIL/uL — ABNORMAL LOW (ref 4.22–5.81)
RDW: 12.7 % (ref 11.5–15.5)
WBC: 9.5 10*3/uL (ref 4.0–10.5)

## 2015-07-29 LAB — TECHNOLOGIST SMEAR REVIEW

## 2015-07-29 LAB — GLUCOSE, CAPILLARY
Glucose-Capillary: 118 mg/dL — ABNORMAL HIGH (ref 65–99)
Glucose-Capillary: 147 mg/dL — ABNORMAL HIGH (ref 65–99)
Glucose-Capillary: 116 mg/dL — ABNORMAL HIGH (ref 65–99)

## 2015-07-29 LAB — DIRECT ANTIGLOBULIN TEST (NOT AT ARMC)
DAT, COMPLEMENT: NEGATIVE
DAT, IgG: NEGATIVE

## 2015-07-29 LAB — PREPARE RBC (CROSSMATCH)

## 2015-07-29 MED ORDER — TAMSULOSIN HCL 0.4 MG PO CAPS
0.4000 mg | ORAL_CAPSULE | Freq: Every day | ORAL | Status: DC
Start: 2015-07-29 — End: 2015-10-05

## 2015-07-29 MED ORDER — SODIUM CHLORIDE 0.9 % IV SOLN
Freq: Once | INTRAVENOUS | Status: AC
  Administered 2015-07-29: 17:00:00 via INTRAVENOUS

## 2015-07-29 NOTE — Discharge Instructions (Signed)
Start taking Tamsulosin 0.4 mg (1 tablet) daily. STOP taking Lisinopril.  Hyperkalemia Hyperkalemia is when you have too much potassium in your blood. Potassium is normally removed (excreted) from your body by your kidneys. If there is too much potassium in your blood, it can affect how your heart works. HOME CARE  Take medicines only as told by your doctor.  Do not take any supplements, natural products, herbs, or vitamins unless your doctor says it is okay.  Limit your alcohol intake as told by your doctor.  Stop illegal drug use. If you need help quitting, ask your doctor.  Keep all follow-up visits as told by your doctor. This is important.  If you have kidney disease, you may need to follow a low potassium diet. A food specialist (dietitian) can help you. GET HELP IF:   Your heartbeat is not regular or very slow.  You feel dizzy (light-headed).  You feel weak.  You feel sick to your stomach (nauseous).  You have tingling in your hands or feet.  You cannot feel your hands or feet. GET HELP RIGHT AWAY IF:  You are short of breath.  You have chest pain.  You pass out (faint).  You cannot move your muscles. MAKE SURE YOU:   Understand these instructions.  Will watch your condition.  Will get help right away if you are not doing well or get worse.   This information is not intended to replace advice given to you by your health care provider. Make sure you discuss any questions you have with your health care provider.   Document Released: 08/11/2005 Document Revised: 09/01/2014 Document Reviewed: 11/16/2013 Elsevier Interactive Patient Education [employer].

## 2015-07-29 NOTE — Progress Notes (Signed)
[name] be D/C'd Home per MD order. Discussed with the patient and all questions fully answered.  Adminstered 1U of PRBCs prior to D/C. MD states "we do not need a CBC drawn after infusion in order for him to leave; he can go."  VSS, Skin clean, dry and intact without evidence of skin break down, no evidence of skin tears noted. IV catheter discontinued intact. Site without signs and symptoms of complications. Dressing and pressure applied.  An After Visit Summary was printed and given to the patient. Prescriptions given to patient.  D/c education completed with patient/family including follow up instructions, medication list, d/c activities limitations if indicated, with other d/c instructions as indicated by MD - patient able to verbalize understanding, all questions fully answered.   Patient instructed to return to ED, call 911, or call MD for any changes in condition.   Patient escorted via wheelchair with nurse tech, and D/C home via private auto.

## 2015-07-29 NOTE — Progress Notes (Signed)
Shift progressed uneventfully. Patient slept well overnight and denied any pain. Initial neurologic assessment was remarkable for relative strength difference between the upper and lower extremities, UE > LE. Upper extremity motor strength noted to be full and symmetric, graded as 5/5. Patient reported comparative diminution of strength in both lower extremities, graded as 4+/5. Vital signs remained stable overnight.

## 2015-07-29 NOTE — Progress Notes (Signed)
Date: 07/29/2015  Patient name: [name]  Medical record number: [id number]  Date of birth: 10/17/1940   I have seen and evaluated [name] and discussed their care with the Residency Team. Briefly, [name] is a 74 yo man with PMH of HTN, HLD, DM2 and h/o CVA without residual deficit who presented with 2-3 months of feeling unwell, weakness and leg cramps. He noted that his "body is breaking down."  He also notes 2-3 days of left sided, aching chest pain, reproduced with palpation which had improved when I saw him.  Further symptoms include difficulty with urination, nocturia, dribbling, frequency, hesitancy but no dysuria.  He notes it is worse when doesn't drink water.  He has a long history of worsening renal function and notes that his doctor has tried to discuss renal replacement therapy and nephrology consult, but he is adamant that he wants neither.  It seems he was likely also told, about 10 years ago, that he needed some sort of prostate surgery, but refused this as well.  Further symptoms include some swallowing difficulties, but he has a hard time describing them, along with constipation.  He is on some medications for his BP, but he is not sure what they are.   ROS: Further ROS was negative.   Filed Vitals:   07/28/15 2214 07/29/15 0603  BP: 126/69 139/70  Pulse: 100 98  Temp: 99.5 F (37.5 C) 99.1 F (37.3 C)  Resp: 20 18   Exam:  Gen: Elderly man, NAD, alert Eyes: Anicteric, EOMI HENT: MMM, neck supple CVS: RR, NR, no murmur noted Pulm: CTAB, no wheezing noted Abd: Soft, +BS Ext: Thickened toenails, no edema Neuro: 5/5 strength throughout, though he notes subjective weakness of LE, difficulty with memory on part of mental status evaluation  Pertinent data Cr. 8.8 BUN 65 Alb 2.9  Hgb 8.4 -> 7.2  Ferritin 502 Iron 17  Renal ultrasound: kidneys consistent with medical renal disease, enlarge prostate protruding into bladder  PSA: 31  FENA  14%  Assessment and Plan: I have seen and evaluated the patient as outlined above. I agree with the formulated Assessment and Plan as detailed in the residents' daily note, with the following changes:   1. Chronic renal failure - Given history, this is most likely chronic in nature, also given minimal change with fluids overnight and imaging results, this is likely long standing.  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 Chronic anemia, likely chronic disease - Multifactorial as well, iron and iron sat are low indicating some amount of iron deficiency - Consider aranesp - Consider ferraheme or outpatient iron therapy.  - FOBT - Protonix  [name] is skeptical of medical procedures and based on our discussion would definitely not agree to HD at this time and possibly would not agree to aggressive management of possibly prostate cancer.  Will discuss with Urology on call, possible  further inpatient work up vs. Outpatient.   Sid Falcon, MD 12/4/20168:54 AM
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
## 2015-07-29 NOTE — Progress Notes (Signed)
Subjective: NAEON. Patient feels much better this morning.  He states he has increased strength and confidence.  He has previously been told that he needed to have his prostate evaluated, but a second doctor told him it was unnecessary and it was never followed.  He states he will never have surgery or dialysis.  His PCP has tried discussing the possibility of dialysis previously but he "doesn't let her talk about that."  He says he will see the Urologist as an outpatient if asked.  He notes some symptomatic improvement with Tamsulosin.  Objective: Vital signs in last 24 hours: Filed Vitals:   07/28/15 1413 07/28/15 2214 07/29/15 0603 07/29/15 0908  BP:  126/69 139/70 148/74  Pulse:  100 98 104  Temp:  99.5 F (37.5 C) 99.1 F (37.3 C)   TempSrc:  Oral Oral   Resp:  20 18   Height: 5\' 8"  (1.727 m)     Weight: 387 lb 12.6 oz (175.9 kg)     SpO2:  100% 100%    Weight change:   Intake/Output Summary (Last 24 hours) at 07/29/15 0920 Last data filed at 07/29/15 0910  Gross per 24 hour  Intake 1307.17 ml  Output   1000 ml  Net 307.17 ml   Physical Exam  Constitutional: He is oriented to person, place, and time. He appears well-developed and well-nourished. No distress.  HENT:  Head: Normocephalic and atraumatic.  Eyes: EOM are normal.  Neck: No tracheal deviation present.  Cardiovascular: Normal rate, regular rhythm and normal heart sounds.   Heart sounds distant.  Pulmonary/Chest: Breath sounds normal.  Abdominal: Soft. He exhibits no distension. There is no tenderness. There is no rebound and no guarding.  Musculoskeletal:  Trace peripheral edema to mdishin.  Neurological: He is alert and oriented to person, place, and time.  Skin: Skin is warm and dry. He is not diaphoretic.    Lab Results: Basic Metabolic Panel:  Recent Labs Lab 07/28/15 0853 07/29/15 0553  NA 134* 135  K 5.2* 4.6  CL 102 106  CO2 19* 18*  GLUCOSE 188* 128*  BUN 63* 65*  CREATININE 8.70*  8.80*  CALCIUM 7.4* 7.4*   Liver Function Tests:  Recent Labs Lab 07/28/15 1550 07/29/15 0553  AST 13* 11*  ALT 9* 8*  ALKPHOS 63 48  BILITOT 1.0 1.0  PROT 6.9 6.0*  ALBUMIN 3.2* 2.9*    Recent Labs Lab 07/28/15 1550  LIPASE 25   No results for input(s): AMMONIA in the last 168 hours. CBC:  Recent Labs Lab 07/28/15 0853 07/29/15 0553  WBC 13.4* 9.5  HGB 8.4* 7.2*  HCT 25.0* 21.4*  MCV 90.6 90.3  PLT 227 197   Cardiac Enzymes:  Recent Labs Lab 07/28/15 2030  CKTOTAL 167   BNP: No results for input(s): PROBNP in the last 168 hours. D-Dimer: No results for input(s): DDIMER in the last 168 hours. CBG:  Recent Labs Lab 07/28/15 1523 07/28/15 1738 07/28/15 2212 07/29/15 0801  GLUCAP 120* 156* 140* 118*   Hemoglobin A1C: No results for input(s): HGBA1C in the last 168 hours. Fasting Lipid Panel: No results for input(s): CHOL, HDL, LDLCALC, TRIG, CHOLHDL, LDLDIRECT in the last 168 hours. Thyroid Function Tests:  Recent Labs Lab 07/28/15 1550  TSH 2.458   Coagulation: No results for input(s): LABPROT, INR in the last 168 hours. Anemia Panel:  Recent Labs Lab 07/28/15 1336 07/28/15 1550  VITAMINB12 243  --   FOLATE  --  13.9  FERRITIN 502*  --   TIBC 164*  --   IRON 17*  --   RETICCTPCT 1.1  --    Urine Drug Screen: Drugs of Abuse     Component Value Date/Time   LABOPIA NEGATIVE 01/18/2008 1346   COCAINSCRNUR NEGATIVE 01/18/2008 1346   LABBENZ NEGATIVE 01/18/2008 1346   AMPHETMU NEGATIVE 01/18/2008 1346    Alcohol Level: No results for input(s): ETH in the last 168 hours. Urinalysis:  Recent Labs Lab 07/28/15 0920  COLORURINE YELLOW  LABSPEC 1.020  PHURINE 6.0  GLUCOSEU 250*  HGBUR MODERATE*  BILIRUBINUR NEGATIVE  KETONESUR NEGATIVE  PROTEINUR 100*  NITRITE NEGATIVE  LEUKOCYTESUR MODERATE*   Misc. Labs:   Micro Results: Recent Results (from the past 240 hour(s))  Rapid strep screen     Status: None   Collection  Time: 07/28/15  9:35 AM  Result Value Ref Range Status   Streptococcus, Group A Screen (Direct) NEGATIVE NEGATIVE Final    Comment: (NOTE) A Rapid Antigen test may result negative if the antigen level in the sample is below the detection level of this test. The FDA has not cleared this test as a stand-alone test therefore the rapid antigen negative result has reflexed to a Group A Strep culture.    Studies/Results: Dg Chest 2 View  07/28/2015  CLINICAL DATA:  Chest pain 3-4 days ago EXAM: CHEST  2 VIEW COMPARISON:  08/05/2011 FINDINGS: Borderline cardiomegaly. Low volumes. Bibasilar atelectasis. Upper lungs clear. No pneumothorax. IMPRESSION: Bibasilar atelectasis. Electronically Signed   By: Marybelle Killings M.D.   On: 07/28/2015 09:12   US Renal  07/28/2015  CLINICAL DATA:  Renal failure. EXAM: RENAL / URINARY TRACT ULTRASOUND COMPLETE COMPARISON:  Abdomen ultrasound dated 12/05/2009. FINDINGS: Right Kidney: Length: 8.7 cm. Diffusely echogenic. Lobulated contours. No mass. 3.0 cm upper pole parapelvic cyst. No hydronephrosis. Left Kidney: Length: 8.8 cm.  Diffusely echogenic.  No hydronephrosis. Bladder: Enlarged prostate gland protruding into the base of the bladder. Otherwise, normal. IMPRESSION: 1. Interval bilateral renal atrophy. 2. Interval increased echogenicity of both kidneys, compatible with medical renal disease. 3. No hydronephrosis. Electronically Signed   By: Claudie Revering M.D.   On: 07/28/2015 18:47   Medications: I have reviewed the patient's current medications. Scheduled Meds: . amLODipine  10 mg Oral Daily  . insulin aspart  0-5 Units Subcutaneous QHS  . insulin aspart  0-9 Units Subcutaneous TID WC  . pantoprazole  40 mg Oral Daily  . polyethylene glycol  17 g Oral Daily  . sodium chloride  3 mL Intravenous Q12H  . tamsulosin  0.4 mg Oral Daily   Continuous Infusions:  PRN Meds:. Assessment/Plan: Principal Problem:   Renal failure Active Problems:   Anemia    Hypertension   Hyperlipidemia   Diabetes mellitus, type 2 (HCC)   History of CVA (cerebrovascular accident)  Mr. Nachman is a 74 yo male with HTN, HLD, DMII, and previous CVA, presenting with 2-3 months worsening weakness and leg cramps.   Renal Failure: Creatinine 8.7 on presentation. Cause of his renal failure is uncertain, as is whether this is acute or chronic. Given his steadily increasing Cr in CareEverywhere results and dislike of medicine management, it is likely chronic renal failure 2/2 poorly controlled HTN and DMII, worsened by postrenal obstruction 2/2 BPH or prostate cancer. CK normal.  FeNa c/w postrenal obstruction.  Renal US demonstrates no hydronephrosis, but large prostate protruding into bladder.  PSA elevated at 35. He has previously told he needs to  be evaluated for his prostate, but no follow up was performed.  He states he will not be "cut on" but will follow with Urology if asked.  Patient is not amenable to dialysis treatment if cause cannot be reversed.  Patient may need Palliative care consult in the future.  - Urology consult - Tamsulosin 0.4 mg  - HOLD Lisinopril  Normocytic Anemia: Patient's Hgb 8.4, down from 16 in 2014. Moderate Hgb in urine, though patient has no smoking history. Anemia may be 2/2 decreased production (chronic renal failure) or loss through GI or urine. He also large prostate with elevated PSA.  Iron panel c/w anemia of chronic disease, which is not surprising in the setting of longstanding renal failure and possible prostate cancer.   - Protonix 40 mg PO  H/o Esophageal Ulcers and Chronic Duodenitis: Patient denies dysphagia or odynophagia. Will continue Protonix.  - Protonix  Mild Cognitive Impairment: appears stable from [facility] notes.   DMII: SSI HTN: Amlodipine HLD: not currently on statin therapy. Will consider initiation on discharge.   FEN/GI:  - HH - Miralax  DVT Ppx: Heparin  Dispo: Disposition is deferred at  this time, awaiting improvement of current medical problems.    Dispo: Disposition is deferred at this time, awaiting improvement of current medical problems.  Anticipated discharge in approximately 1 day(s).   The patient does have a current PCP (Provider Default, MD) and does need an [facility] hospital follow-up appointment after discharge.  The patient does not have transportation limitations that hinder transportation to clinic appointments.  .Services Needed at time of discharge: Y = Yes, Blank = No PT:   OT:   RN:   Equipment:   Other:     LOS: 1 day   Iline Oven, MD 07/29/2015, 9:20 AM

## 2015-07-29 NOTE — Evaluation (Signed)
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 He noted that his "body is breaking down." He also notes 2-3 days of left sided, aching chest pain, reproduced with palpation which had improved when I saw him. Further symptoms include difficulty with urination, nocturia, dribbling, frequency, hesitancy but no dysuria. He notes it is worse when doesn't drink water. He has a long history of worsening renal function and notes that his doctor has tried to discuss renal replacement therapy and nephrology consult, but he is adamant that he wants neither. It seems he was likely also told, about 10 years ago, that he needed some sort of prostate surgery, but refused this as well. Further symptoms include some swallowing difficulties, but he has a hard time describing them, along with constipation. He is on some medications for his BP, but he is not sure what they are.    Clinical Impression  Limited evaluation completed at RN request as pt receiving blood.  Pt generally needs minimal assistance to stand EOB and use urinal, however unclear whether and to what extent ambulation is limited.  Will see next date and fully assess as able, make recommendations for d/c at that time.  Concerned based on medical record that pt will lack motivation to adhere to therapy plan once in place, will need to address for best outcome.    Follow Up Recommendations Other (comment) (tbd once full eval completed)    Equipment Recommendations  Other (comment) (tbd once full mobility assessment completed)    Recommendations for Other Services       Precautions / Restrictions Precautions Precautions: Fall Precaution Comments: up with assist      Mobility  Bed  Mobility Overal bed mobility: Modified Independent             General bed mobility comments: uses rails to adjust position but able to move to/from EOB unassisted  Transfers Overall transfer level: Needs assistance   Transfers: Sit to/from Stand Sit to Stand: Min assist         General transfer comment: stood EOB to urinate in urinal; assist to lift off and steady in standing, takes several attempts to fully stand while leaning legs against bed to assist; stands with 5-10% assist; sits with moderate control but does not follow direction re: where to sit  Ambulation/Gait Ambulation/Gait assistance:  (deferred until RN approves; pt getting PRBCs)              Stairs            Wheelchair Mobility    Modified Rankin (Stroke Patients Only)       Balance Overall balance assessment: No apparent balance deficits (not formally assessed) (deferred until RN clears full mobility; assess next visit)                                           Pertinent Vitals/Pain Pain Assessment: No/denies pain    Home Living Family/patient expects to be discharged to:: Private residence Living Arrangements: Other relatives Available Help at Discharge: Family;Available PRN/intermittently Type of Home: House Home Access: Stairs to enter   [employer] of Steps: 10 (flight?  down to basement of sister's home) Home Layout: Two level;Able to live on main level with bedroom/bathroom Home Equipment: None Additional Comments: see chart for details of living situation    Prior Function Level of Independence: Independent               Hand Dominance        Extremity/Trunk Assessment   Upper Extremity Assessment: Defer to OT evaluation           Lower Extremity Assessment: Generalized weakness         Communication   Communication: No difficulties  Cognition Arousal/Alertness: Awake/alert Behavior During Therapy: WFL for tasks  assessed/performed Overall Cognitive Status: Within Functional Limits for tasks assessed                      General Comments General comments (skin integrity, edema, etc.): pt getting PRBC, RN request hold PT eval; PT assisted pt to stand to urinate then terminated session.  No adverse reaction noted with minimal mobility    Exercises        Assessment/Plan    PT Assessment Patient needs continued PT services  PT Diagnosis Generalized weakness   PT Problem List Decreased strength;Decreased activity tolerance;Decreased balance;Decreased mobility;Decreased knowledge of use of DME;Cardiopulmonary status limiting activity  PT Treatment Interventions Patient/family education;Balance training;Therapeutic exercise;Therapeutic activities;Functional mobility training;Stair training;Gait training;DME instruction   PT Goals (Current goals can be found in the Care Plan section) Acute Rehab PT Goals Patient Stated Goal: go home PT Goal Formulation: With patient Time For Goal Achievement: 08/12/15 Potential to Achieve Goals: Fair    Frequency Min 3X/week   Barriers to discharge Inaccessible home environment;Decreased caregiver support unclear whether and to what extent sister helps; steps down to access basement apartment    Co-evaluation               End of Session   Activity Tolerance: Patient tolerated treatment well Patient left: in bed;with call bell/phone within reach           Time: 1546-1559 PT Time Calculation (min) (ACUTE ONLY): 13 min   Charges:   PT Evaluation $Initial PT Evaluation Tier I: 1 Procedure     PT G Codes:        [name] 07/29/2015, 4:19 PM

## 2015-07-29 NOTE — Progress Notes (Signed)
Utilization Review Completed.Lenin Kuhnle T12/11/2014  

## 2015-07-30 LAB — TYPE AND SCREEN
ABO/RH(D): O NEG
ANTIBODY SCREEN: NEGATIVE
UNIT DIVISION: 0

## 2015-07-30 LAB — HEMOGLOBIN A1C
HEMOGLOBIN A1C: 6.4 % — AB (ref 4.8–5.6)
MEAN PLASMA GLUCOSE: 137 mg/dL

## 2015-07-30 LAB — CULTURE, GROUP A STREP: STREP A CULTURE: NEGATIVE

## 2015-08-02 LAB — CULTURE, BLOOD (ROUTINE X 2)
CULTURE: NO GROWTH
Culture: NO GROWTH

## 2015-08-09 ENCOUNTER — Other Ambulatory Visit: Payer: Self-pay

## 2015-08-09 NOTE — Patient Outreach (Signed)
[place] [facility]) Care Management  08/09/2015  [name] [birth year] [id number]   Inbound call received from [place]; contact:  Hettie Holstein, LPN  [phone].   States patient needs SW referral to address poor living arrangement and cognitive deficits.  RN CM attempted call back to Ms. Coley to get further update on current needs.  RN CM left message requesting call back.   Referral Date:  08/02/15 Referral Source:  [facility] Referral Issue:  HTN, DM, Renal Failure, CVA.  H/o Admission: 07/28/2015- 07/29/2015  - Chronic renal failure and Prostate Cancer  Outreach call #1 to patient.  Patient not reached following several rings.   RN CM scheduled for next outreach call within one week.     [name], RN, BSN, [facility], CCM  [employer] Management Coordinator [phone] Direct [phone] Cell [phone] Office [phone] Fax

## 2015-08-10 ENCOUNTER — Other Ambulatory Visit: Payer: Self-pay

## 2015-08-10 DIAGNOSIS — E118 Type 2 diabetes mellitus with unspecified complications: Secondary | ICD-10-CM

## 2015-08-10 NOTE — Patient Outreach (Signed)
[place] [facility]) Care Management  08/10/2015  [name] 1941-04-28 [id number]   Inbound call received from [place]; contact: Hettie Holstein, LPN [phone] regarding patient updates.    Psycho/Social:   H/o Patient lives in basement of his sister's home. Basement is cold and only recently was able to get a working stove.  Patient does have a microwave. [facility] has ordered meals for patient. [facility] has sent a [place] referral on 08/06/15 - Telephonic assessment pending but states most patients have issues with covering the co-pay and may not follow-up.  Patient's sister has advised patient he may remain in the basement until March 2017.  Patient has reported to [facility]; "I can't live by myself" but has refused facility options. Patient delays making decisions about his healthcare due to his priority concern regarding finding a place to live.  H/o patient cries a lot during conversation with [facility] contact calls.  Patient scored 20 on [facility] depression screening.  Patient is not currently on any medication for depression management.   Urology: Prostate:  Nephrology Referral with [facility] pending 08/16/2015.  This appt has been cancelled several times by patient due to patient is more focused on living arrangements and delays his healthcare needs.  H/o patient has a PSA 35 and prostate protrudes into the bladder.   Acute on Chronic Renal Failure H/o patient has refused dialysis but reported to [facility] he does not want to die.   Medications: H/o some confusion has been noted with past medication reconciliation including [place] contact call with [facility].  Patient had issues understanding his medications and had missed six discharge medications  Plan: RN CM will continue follow-up attempts with patient. Appt scheduled for next outreach today (08/10/2015).  Patient phone verification:  [phone] with [facility] on  08/10/2015  [name], RN, BSN, [facility], [place] Management Care Management Coordinator [phone] Direct [phone] Cell [phone] Office [phone] Fax

## 2015-08-10 NOTE — Patient Outreach (Signed)
[place] [facility]) Care Management  08/10/2015  [name] 1942/08/73 [id number]   Care Coordination Services.   Contact call to Primary MD office:  Dr. Betty Martinique Issue:  [facility] SN order needed.  Home Safety Evaluation Medication Management  Disease Management: DM  Assess patients confusion, vitals, chest pain with breathing as patient has h/o ED/admission on 07/28/2015 for same complaints.   Patient has no preference for Home Care Agency.  RN CM contacted [place] to confirm in-network with [facility].  [place]  [phone] phone [phone] fax for orders. Please provide update back to RN CM with update.    [name], RN, BSN, [facility], CCM  [employer] Management Coordinator [phone] Direct [phone] Cell [phone] Office [phone] Fax

## 2015-08-10 NOTE — Patient Outreach (Signed)
[place] [facility]) Care Management  08/10/2015  [name] [birth year] [id number]  Care Coordination Services  Issue:  Nephrology appt verification:  [facility] Nephrology [address]  [id number] [place], [place] [address] [phone] Office Contact verified Appt:  Dr. Audie Clear on 08/16/2015 @ 1:20pm (Be there by 1:00pm) Specialist Co-pay $45.00  Plan:   RN CM will provide patient with update.   [name], RN, BSN, [facility], CCM  [employer] Management Coordinator [phone] Direct [phone] Cell [phone] Office [phone] Fax

## 2015-08-10 NOTE — Patient Outreach (Signed)
[place] [facility]) Care Management  08/10/2015  [name] 01-Apr-1941 [id number]   Telephonic Screening    Referral Date: 08/02/15 Referral Source: [facility] Referral Issue: HTN, DM, Renal Failure, CVA.  H/o Admission: 07/28/2015- 07/29/2015 - Chronic renal failure and Prostate Cancer  Outreach call #2 to patient. Patient reached and Screening completed.  Patient prefers to be call  "[name]" Patient states he is confused and a very sick man this morning; states he is not sure if he has Pneumonia or not.   Patient has pain with breathing (inhaling and exhaling).  Pain presented last night around 10:00pm.  Patient feels short of breath.  States he ate breakfast this morning but is not feeling any better.  Fever: unknown but does have chills.  States he went to the hospital a week ago for the same symptoms and was checked out very well with no heart issues.   Providers: Primary MD:  Dr. Betty G. Martinique - [facility] [address]  Renal function and anemia evaluation completed on 08/09/2015 [place] [address] [phone]  Patient states he has appt to see a "[place] doctor" this month but does not know MDs name, number or where to go.   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  Military History:  None  Living:  H/o Patient is divorced 79 years and  lives in basement of his sister's home. Basement is cold and only recently was able to get a working stove. Patient does have a microwave. [facility] has ordered meals for patient.  Patient has not received any [employer] meals yet and states "I do not know how to manage  anything like that."  Patient's sister has advised patient he may remain in the basement until March 2017. Patient has reported to [facility]; "I can't live by myself" but has refused facility options in past conversation. Patient delays making decisions about his healthcare due to his priority concern regarding finding a place to live. Mobility:  Ambulation is more difficult today due to shortness of breath.  Falls:  None but states he is getting weaker and fearful of falling.  Pain:  Chest with breathing  Education:  11th grade but states he did not focus on learning and is very uneducated. Depression:  Patient crying throughout screening call but with appropriate sadness as relates to conversation.   Patient verbalized much disappointment and loss of hope and fear associated to his inability to self manage his health care or basic needs.   H/o patient cries a lot during conversation with [facility] contact calls. Patient scored 20 on [facility] depression screening. Patient is not currently on any medication for depression management.  H/o [facility] sent a [place] referral on 08/06/15 - Telephonic assessment pending but H/o most patients have issues with covering the co-pay and may not follow-up.  Transportation: owns a care and still driving.  Caregiver: son living in [place] and asked son about living with him but son advised patient his  landlord would not allow anyone else to live with him.  Granddaughter 52yo who provides no support.  Brother in [place] and in [place] but provides patient with no help.  Patient verbalized much disappointment  that his family is not taking care of each other. Advanced Directive:  None - states "I only want myself making decisions". Resources:  Humana Meals to start SSI:  $1,300.00 per month.  DME:  glasses, Smoker:  None ETOH:  None   THN conditions:  Diabetes Admissions: 1 ER visits: 0 H/o Admission: 07/28/2015- 07/29/2015 - Chronic renal failure and  Prostate Cancer  Renal Failure and Prostate Enlargement  Prostate: H/o patient has a PSA 35 and prostate protrudes into the bladder.  H/o Nephrology Referral with [facility] pending 08/16/2015. H/o This appt has been cancelled several times by patient due to patient is more focused on living arrangements and delays his healthcare needs.   Acute on Chronic Renal Failure H/o patient has refused dialysis but reported to [facility] he does not want to die.   Medications: H/o some confusion has been noted with past medication reconciliation including [place] contact call with [facility]. H/o Patient had issues understanding his medications and had missed six discharge medications per [facility].  Patient states he is only taking 3 medications.  States "I can't hardly take them; I get confused and I can not manage my medications."  Patient can read but is not able to read the name of his medications.  States he has dim vision and is not "educated." Co-pay cost issues:  Patient does not know the cost of his medications.  Pharmacy:  [facility] Mail order.  Flu Vaccine:  States no that he refuses the vaccine because he is afraid he will get the flu and die.  Last known vaccine date 07/27/2013 (per [facility] MR).  Consent: Patient agreed to [facility] services.  Patient stated he is feeling better while on the call with this Probation officer today.  States "When you feel like your are going to get help you feel ... [hope] and the spirit comes over you."   Plan:  CM start date:  08/10/2015 Partial Initial Assessment initiated 12/16/20156  RN CM will contact PCP to request [facility]  Home Safety Evaluation Medication Management  Disease Management:  DM   Assess patients confusion, vitals, chest pain with breathing as patient has h/o ED/admission on 07/28/2015 for same complaints.   Patient Education: RN CM provided patient education on signs and symptoms of depression.  RN CM requested patient to  remain open minded to consideration to possible medication management if MD recommends.  RN CM advised that cognitive/memory/confusion can be associated to patient's poor renal function and depression. (h/o hemoglobin 7.2 on 07/29/15 discharge date).    [place] RN CM  Referral  -Complex case management services -H/o Admission: 07/28/2015- 07/29/2015 - Chronic renal failure and Prostate Cancer -Care coordination services:  PCP - Depression management.  -Patient unable to self manage his healthcare coordination needs.  -Patient needs help with communicated his health conditions at a level that patient can understand. Patient does not read well enough to obtain education through written education.   [facility] Social Worker Referral (Complex) -Chiropodist -Housing resources and assistance -[place] assessment   [facility] Pharmacy Referral  -Medication non-adherence -Patient lacks understanding of his medication names and how to manage his medications.  -Patient needs alternate means of management rather than reading.   RN CM notified [facility] Management Assistant: agreed to services/case opened.  RN CM will schedule for next [facility] contact call within 2 weeks.   RN CM advised to please notify MD of any changes in condition  prior to scheduled appt's.   RN CM provided contact name and # [phone] or main office # [phone] and 24-hour nurse line # [phone].  RN CM confirmed patient is aware of 911 services for urgent emergency needs.  [name], RN, BSN, [facility], CCM  [employer] Management Coordinator [phone] Direct [phone] Cell [phone] Office [phone] Fax

## 2015-08-10 NOTE — Patient Outreach (Signed)
[place] [facility]) Care Management  08/10/2015  Melio Wasil [birth year] [id number]  Care Coordination Services:   RN CM contacted [facility] to confirm the following appt:  (Per 07/29/2015  Discharge Summary)  Prostate Evaluation appt scheduled 08/16/2015  [facility] [place] [phone] Prostate Evaluation  Office contact confirms patient has no appt scheduled.    Plan:   RN CM will continue to follow-up on Nephrology appt verification.   [name], RN, BSN, [facility], CCM  [employer] Management Coordinator [phone] Direct [phone] Cell [phone] Office [phone] Fax

## 2015-08-14 ENCOUNTER — Other Ambulatory Visit: Payer: Self-pay | Admitting: Licensed Clinical Social Worker

## 2015-08-14 NOTE — Patient Outreach (Signed)
[place] [facility]) Care Management  08/14/2015  Xan Jasso 1941/05/20 [id number]   Assessment-This CSW is covering for [place]. CSW received referral as patient is in need of social work assistance. CSW completed initial outreach on 08/14/15 but was unable to reach patient successfully. CSW was unable to leave voice message and phone continuously rang.   Plan-CSW will make second outreach attempt by 08/17/15.  Eula Fried, BSW, MSW, [email or web address] Phone: [phone] Fax: [phone]

## 2015-08-17 ENCOUNTER — Other Ambulatory Visit: Payer: Self-pay | Admitting: Licensed Clinical Social Worker

## 2015-08-17 ENCOUNTER — Other Ambulatory Visit: Payer: Self-pay

## 2015-08-17 NOTE — Patient Outreach (Signed)
[place] [facility]) Care Management  08/17/2015  Dashiell Chevere [birth year] [id number]  Care Coordination Services  Inbound call received from Primary MD:  Dr. Martinique office contact: Tonia [place].  Confirms HH order faxed to [facility].   Plan:   RN CM will notify other [place] disciplines assigned to case.  Greg Cutter, [place], [place], NP  [name], RN, BSN, [facility], CCM  [facility] Management Coordinator [phone] Direct [phone] Cell [phone] Office [phone] Fax

## 2015-08-17 NOTE — Patient Outreach (Signed)
[place] [facility]) Care Management  08/17/2015  [name] [birth year] [id number]   Assessment-CSW completed second outreach and was able to reach patient successful. This CSW is currently covering for [place]. Patient answered and was able to provide HIPPA verifications. CSW introduced self, reason for call and of [place] social work services. Patient agreeable to social work assistance. Patient shares that "I must have talked to the nurse on a bad day but I am feeling better today. That is how it goes. I have good and bad days." Patient reports that he is working with his behavioral health [employer] case worker. Patient shares that he takes 3 medications to alleviate depressive symptoms but has previously informed RNCM that he does not take psychotropics. Patient reports that his ongoing health conditions contribute to his depressive symptoms. CSW discussed long term goals with patient. Patient has considered nursing facility placement but shares that he has a cat that means a lot to him and he would need to be able to bring cat. Patient also shares "I'm not sure if I am at that point yet but it is something to consider." Patient shares that he wishes to relocate as he is currently residing in his sister's basement. Patient pays $400 in rent each month to his sister. Patient reports that he is willing to find somewhere to rent that is $400 -$450 for rent per month. Patient reports that he is in need of housing resources, food pantry information and financial assistance resources. CSW will mail out social work resources: [employer], Building surveyor information and where to receive a free meal daily within the community, [employer] for Seniors in [place] and financial assistance resources for [employer].  Plan-CSW will send in basket message to Care Management Assistant to mail out social work resources. CSW will  update CSW [employer].  Eula Fried, BSW, MSW, [email or web address] Phone: [phone] Fax: [phone]

## 2015-08-22 NOTE — Patient Outreach (Signed)
[place] [facility]) Care Management  08/22/2015  [name] [birth year] [id number]   Request received from Eula Fried, LCSW to mail patient information on housing resources, community resources, financial assistance resources, and food pantry resources. Information mailed today, 08/22/15.  [name] [facility] CM Assistant

## 2015-08-23 ENCOUNTER — Other Ambulatory Visit: Payer: Self-pay | Admitting: *Deleted

## 2015-08-23 NOTE — Patient Outreach (Signed)
I called pt today and was able to talk with him. I met him several years ago when I was seeing his mother. I have reviewed my associates notes and have seen their concerns. It seems that placement in a group home will be most beneficial for this gentleman. I will meet with him tomorrow to discuss a plan of action.  Deloria Lair [facility] [place] [phone]

## 2015-08-24 ENCOUNTER — Ambulatory Visit: Payer: Self-pay | Admitting: *Deleted

## 2015-08-27 ENCOUNTER — Other Ambulatory Visit: Payer: Self-pay

## 2015-08-27 NOTE — Patient Outreach (Signed)
I attempted to call Mr. Terrasi and discuss his medications with him but was unable to leave a message due to a busy signal.  I will try to call him back at a later date.    Deanne Coffer, PharmD, [place] [phone]

## 2015-08-28 ENCOUNTER — Other Ambulatory Visit: Payer: Self-pay | Admitting: *Deleted
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
## 2015-08-30 ENCOUNTER — Other Ambulatory Visit: Payer: Self-pay | Admitting: *Deleted

## 2015-08-30 ENCOUNTER — Encounter: Payer: Self-pay | Admitting: *Deleted

## 2015-08-30 DIAGNOSIS — C801 Malignant (primary) neoplasm, unspecified: Secondary | ICD-10-CM | POA: Insufficient documentation

## 2015-08-31 ENCOUNTER — Encounter: Payer: Self-pay | Admitting: *Deleted

## 2015-08-31 NOTE — Patient Outreach (Signed)
S:  Pt referred to [facility] after early December Admission. He is known to me as I cared for his mother prior to her passing several years ago. He has a good living situation, living in a finished basement with 2 bedrooms, full bath, kitchen and living room that is heated. His sister took he and his mother in when their home was cleared to put in the new bypass. He has lived with N[place]a Alpha for about 2-3 years now. He needs to find a new home, hopefully with some medication administation assistance and meals provided. He has agreed a group home sounds good.  He has always been know to be noncompliant with medical recommnedatons. But he seems like he trusts me and does not flat out tell me "no," with some of the suggestions I make.  O: Pt is in his winter coat inside the warm home. I am comtable without my coat. He says he is freezing.       During our conversation he often breaks down and cries, approximatly 8 times during our 2 hour visit.         BP 140/60 mmHg  Pulse 108  Resp 16  Wt 178 lb (80.74 kg)  SpO2 98%       RRR       Lungs are clear   Pt does not have his RX of Tamsulosin needs this called in the mailorder.  Current Outpatient Prescriptions on File Prior to Visit  Medication Sig Dispense Refill  . amLODipine (NORVASC) 10 MG tablet Take 1 tablet (10 mg total) by mouth daily. 30 tablet 0  . pantoprazole (PROTONIX) 40 MG tablet Take 1 tablet (40 mg total) by mouth daily. 30 tablet 0  . nitroGLYCERIN (NITROSTAT) 0.4 MG SL tablet Place 0.4 mg under the tongue every 5 (five) minutes as needed for chest pain. Reported on 08/30/2015    . tamsulosin (FLOMAX) 0.4 MG CAPS capsule Take 1 capsule (0.4 mg total) by mouth daily. (Patient not taking: Reported on 08/30/2015) 30 capsule 0   No current facility-administered medications on file prior to visit.     Fall Risk  08/30/2015 08/10/2015  Falls in the past year? No No  Risk for fall due to : Impaired balance/gait Impaired mobility  Risk for  fall due to (comments): - C/O weakness   Depression screen [facility] 2/9 08/30/2015 08/10/2015 01/02/2015  Decreased Interest 0 2 1  Down, Depressed, Hopeless 1 2 0  PHQ - 2 Score 1 4 1   Altered sleeping 2 1 -  Tired, decreased energy 2 1 -  Change in appetite 2 1 -  Feeling bad or failure about yourself  2 2 -  Trouble concentrating 3 2 -  Moving slowly or fidgety/restless 0 0 -  Suicidal thoughts 0 0 -  PHQ-9 Score 12 11 -  Difficult doing work/chores Somewhat difficult Very difficult -   THN CM Care Plan Problem One        Most Recent Value   Care Plan Problem One  Pt needs to find refuge in a new place, ideally a group home would suit his needs.   Role Documenting the Problem One  Care Management [place] for Problem One  Active   THN Long Term Goal (31-90 days)  Pt will find and move into new location in March 2017.   THN Long Term Goal Start Date  08/31/15   Interventions for Problem One Long Term Goal  Work with CSW  and pt's sone to provide lists of available locations, recommendations for appropriate housing.   THN CM Short Term Goal #1 (0-30 days)  Pt will receive information about group homes and schedule visit times with his son over the next 30 days.   THN CM Short Term Goal #1 Start Date  08/31/15   Interventions for Short Term Goal #1  Have asked CSW to provide a list and make suggestions about these homes.    THN CM Care Plan Problem Two        Most Recent Value   Care Plan Problem Two  Multiple chronic illnesses that pt has refused to manage in the past resulting in complications: DM and now CKD   Role Documenting the Problem Two  Care Management [place] for Problem Two  Active   THN CM Short Term Goal #1 (0-30 days)  Pt will have all his prescribed meds and show evidence that he is taking them from med box on daily schedule.   Interventions for Short Term Goal #2   Provide medicatron box, will check medication box on each visit to assess how  well he is doing.    THN CM Care Plan Problem Three        Most Recent Value   Care Plan Problem Three  Needs treatment for depression and PBA   Role Documenting the Problem Three  Care Management Coordinator   Care Plan for Problem Three  Active   THN Long Term Goal Start Date  08/31/15   Interventions for Problem Three Long Term Goal  Discussed need for treatment of these things that affecti his quality of life: feeling sad, crying alot/inappropriately, poor sleep, wt loss. Will ask MD to consider suggestions.     I have given him a HCPOA , living will and MOST form and will strive to get those completed in the next month.  I will see pt again in 2 weeks.  Deloria Lair [facility] [place] [phone]

## 2015-09-03 ENCOUNTER — Other Ambulatory Visit: Payer: Self-pay | Admitting: *Deleted

## 2015-09-03 NOTE — Patient Outreach (Signed)
[facility]) Care Management  09/03/2015  [name] [birth year] [id number]   CSW received a request from [name], Geriatric Nurse Practitioner with [place] [facility], indicating that patient is interested in finding a group home in the [place], [place] area, requesting assistance with obtaining permanent placement.  CSW agreed to mail a list of group home resources to patient's home for his review.  CSW has submitted an In [employer] to [employer], Care Psychologist, prison and probation services with [place] [facility], requesting that she mail the printed information to patient's home.  No additional social work needs identified at this time so CSW will perform a case closure.  CSW will fax a correspondence letter to patient's Primary Care Physician, Betty Martinique to ensure that Dr. Martinique is aware of CSW's involvement with patient's care.  CSW will submit a case closure request to Lurline Del, Care Management Assistant with [place] [facility], ensuring that Mrs. Laurance Flatten is aware of Ms. Spinks continued involvement with patient's care.  Nat Christen, BSW, MSW, LCSW  Licensed Education officer, [facility]  Mailing [place] [address], [place], [place] [address] Physical Address-[address], [place], [place] [address] Toll Free Main # [phone] Fax # [phone] Cell # [phone]  Fax # [phone]  [email or web address]

## 2015-09-13 NOTE — Patient Outreach (Signed)
[facility]) Care Management  09/13/2015  [name] 1942/07/74 [id number]   Request received from Nat Christen, LCSW to mail patient information on housing resources. Information mailed today, 09/13/15.   Jacqulynn Cadet  [facility] Care Management Assistant

## 2015-09-14 ENCOUNTER — Other Ambulatory Visit: Payer: Self-pay | Admitting: *Deleted

## 2015-09-14 NOTE — Patient Outreach (Signed)
[facility]) Care Management  09/14/2015  Bennett Ram May 10, 1941 [id number]  S:  Pt states he is doing OK. He tells me he'd like to go to [place] to live cause he could live cheaper. Pt has all his meds today.    O:  BP 150/80 mmHg  Pulse 93  Resp 16  SpO2 97% NFBS: 131       RRR       Lungs are clear.        A:  Diabetes      Needs placement  P:  Discussed placement at group home. Told him about [facility] in [place]. I will send information to his son so they can plan a visit.  I have filled out an FL2 form but this will need to be signed by Dr. Martinique.  I will plan on visiting pt in another 2 weeks.  THN CM Care Plan Problem One        Most Recent Value   Care Plan Problem One  Pt needs to find refuge in a new place, ideally a group home would suit his needs.   Role Documenting the Problem One  Care Management [place] for Problem One  Active   THN Long Term Goal (31-90 days)  Pt will find and move into new location in March 2017.   THN Long Term Goal Start Date  08/31/15   Interventions for Problem One Long Term Goal  Discussed a location that would be a good fit for him, Will send son the information.   THN CM Short Term Goal #1 (0-30 days)  Pt will receive information about group homes and schedule visit times with his son over the next 30 days.   THN CM Short Term Goal #1 Start Date  08/31/15   [facility] CM Short Term Goal #1 Met Date  09/14/15   Interventions for Short Term Goal #1  Discussed group home.    THN CM Care Plan Problem Two        Most Recent Value   Care Plan Problem Two  Multiple chronic illnesses that pt has refused to manage in the past resulting in complications: DM and now CKD   Role Documenting the Problem Two  Care Management [place] for Problem Two  Active   THN CM Short Term Goal #1 (0-30 days)  Pt will have all his prescribed meds and show evidence that he is taking them from med box on daily  schedule.   THN CM Short Term Goal #1 Met Date   09/14/15   Interventions for Short Term Goal #2   Encouraged pt to take his meds daily.    [facility] CM Care Plan Problem Three        Most Recent Value   Care Plan Problem Three  Needs treatment for depression and PBA   Role Documenting the Problem Three  Care Management Coordinator   Care Plan for Problem Three  Active   THN Long Term Goal Start Date  08/31/15   Interventions for Problem Three Long Term Goal  Pt mood is improved today without medical treatment. Will continue to monitor.      Deloria Lair [facility] [phone]

## 2015-09-28 ENCOUNTER — Ambulatory Visit: Payer: Self-pay | Admitting: *Deleted

## 2015-10-02 ENCOUNTER — Other Ambulatory Visit: Payer: Self-pay | Admitting: *Deleted

## 2015-10-02 NOTE — Patient Outreach (Addendum)
S:  Pt feels weak, tired and complaining of constipation. He did buy the generic Miralax but hasn't used it yet. He says he has not taken his meds this am. He has all his meds but pantoprazole.  His son, [name], has not taken him to any of the facilities that I sent him information on.  O:  BP 160/90 mmHg  Pulse 80  Resp 16  SpO2 98% NFBS 91       RRR       Lungs are clear.       Fingernails and Toenails are very long and dirty. Calluses on plantar surface of both feet.            2+ pedal pulses, Pt able to identify all 10 monofilment touch areas bilaterally.  A:  Diabetes      Poor diabetic foot condition  P:  Provided diabetic foot care: debrided long toe nails and pared down all calluses.        Advised pt to wash and dry feet daily, apply lotion, apply Vicks Vapor Rub to thickened toenails. Wear white socks.       I will reassess feet in one week to ensure areas where there was superficial bleeding are not showing any signs of             Infection.        I will ask Dr. Martinique to give pt an RX for diabetic shoes.  Deloria Lair GNP-BC [place] [phone]  Diabetic Foot Exam - Simple   Simple Foot Form  Visual Inspection  See comments:  Yes  Sensation Testing  Intact to touch and monofilament testing bilaterally:  Yes  Pulse Check  Posterior Tibialis and Dorsalis pulse intact bilaterally:  Yes  Comments  Neglected feet. Extremely long nails and thick calluses. I debrieded toenails and pared down calluses.  Deloria Lair [facility] [phone]

## 2015-10-04 ENCOUNTER — Encounter (HOSPITAL_COMMUNITY): Payer: Self-pay | Admitting: Emergency Medicine

## 2015-10-04 ENCOUNTER — Observation Stay (HOSPITAL_COMMUNITY)
Admission: EM | Admit: 2015-10-04 | Discharge: 2015-10-05 | Disposition: A | Discharge status: Hospice | Payer: Commercial Managed Care - HMO | Attending: Internal Medicine | Admitting: Internal Medicine

## 2015-10-04 DIAGNOSIS — E785 Hyperlipidemia, unspecified: Secondary | ICD-10-CM | POA: Diagnosis not present

## 2015-10-04 DIAGNOSIS — Z66 Do not resuscitate: Secondary | ICD-10-CM | POA: Diagnosis not present

## 2015-10-04 DIAGNOSIS — E1165 Type 2 diabetes mellitus with hyperglycemia: Secondary | ICD-10-CM | POA: Diagnosis not present

## 2015-10-04 DIAGNOSIS — D631 Anemia in chronic kidney disease: Secondary | ICD-10-CM | POA: Insufficient documentation

## 2015-10-04 DIAGNOSIS — N185 Chronic kidney disease, stage 5: Secondary | ICD-10-CM | POA: Insufficient documentation

## 2015-10-04 DIAGNOSIS — K219 Gastro-esophageal reflux disease without esophagitis: Secondary | ICD-10-CM | POA: Diagnosis not present

## 2015-10-04 DIAGNOSIS — G3184 Mild cognitive impairment, so stated: Secondary | ICD-10-CM | POA: Insufficient documentation

## 2015-10-04 DIAGNOSIS — R5383 Other fatigue: Secondary | ICD-10-CM | POA: Diagnosis not present

## 2015-10-04 DIAGNOSIS — N400 Enlarged prostate without lower urinary tract symptoms: Secondary | ICD-10-CM | POA: Insufficient documentation

## 2015-10-04 DIAGNOSIS — D638 Anemia in other chronic diseases classified elsewhere: Secondary | ICD-10-CM

## 2015-10-04 DIAGNOSIS — E1122 Type 2 diabetes mellitus with diabetic chronic kidney disease: Secondary | ICD-10-CM | POA: Diagnosis not present

## 2015-10-04 DIAGNOSIS — M1990 Unspecified osteoarthritis, unspecified site: Secondary | ICD-10-CM | POA: Insufficient documentation

## 2015-10-04 DIAGNOSIS — I120 Hypertensive chronic kidney disease with stage 5 chronic kidney disease or end stage renal disease: Secondary | ICD-10-CM | POA: Insufficient documentation

## 2015-10-04 DIAGNOSIS — D649 Anemia, unspecified: Secondary | ICD-10-CM | POA: Diagnosis present

## 2015-10-04 DIAGNOSIS — R972 Elevated prostate specific antigen [PSA]: Secondary | ICD-10-CM | POA: Insufficient documentation

## 2015-10-04 DIAGNOSIS — N19 Unspecified kidney failure: Secondary | ICD-10-CM

## 2015-10-04 DIAGNOSIS — Z8673 Personal history of transient ischemic attack (TIA), and cerebral infarction without residual deficits: Secondary | ICD-10-CM | POA: Diagnosis not present

## 2015-10-04 DIAGNOSIS — R531 Weakness: Secondary | ICD-10-CM | POA: Insufficient documentation

## 2015-10-04 DIAGNOSIS — M791 Myalgia: Secondary | ICD-10-CM | POA: Insufficient documentation

## 2015-10-04 DIAGNOSIS — N186 End stage renal disease: Secondary | ICD-10-CM | POA: Insufficient documentation

## 2015-10-04 DIAGNOSIS — Z515 Encounter for palliative care: Secondary | ICD-10-CM

## 2015-10-04 DIAGNOSIS — D509 Iron deficiency anemia, unspecified: Secondary | ICD-10-CM | POA: Diagnosis not present

## 2015-10-04 LAB — COMPREHENSIVE METABOLIC PANEL
ALK PHOS: 84 U/L (ref 38–126)
ALT: 8 U/L — AB (ref 17–63)
AST: 9 U/L — AB (ref 15–41)
Albumin: 3.1 g/dL — ABNORMAL LOW (ref 3.5–5.0)
Anion gap: 19 — ABNORMAL HIGH (ref 5–15)
BILIRUBIN TOTAL: 0.6 mg/dL (ref 0.3–1.2)
BUN: 93 mg/dL — AB (ref 6–20)
CALCIUM: 7.8 mg/dL — AB (ref 8.9–10.3)
CHLORIDE: 101 mmol/L (ref 101–111)
CO2: 18 mmol/L — ABNORMAL LOW (ref 22–32)
CREATININE: 11.74 mg/dL — AB (ref 0.61–1.24)
GFR, EST AFRICAN AMERICAN: 4 mL/min — AB (ref >60)
GFR, EST NON AFRICAN AMERICAN: 4 mL/min — AB (ref >60)
Glucose, Bld: 153 mg/dL — ABNORMAL HIGH (ref 65–99)
Potassium: 4.8 mmol/L (ref 3.5–5.1)
Sodium: 138 mmol/L (ref 135–145)
Total Protein: 6.6 g/dL (ref 6.5–8.1)

## 2015-10-04 LAB — URINALYSIS, ROUTINE W REFLEX MICROSCOPIC
Bilirubin Urine: NEGATIVE
Glucose, UA: 250 mg/dL — AB
KETONES UR: NEGATIVE mg/dL
LEUKOCYTES UA: NEGATIVE
NITRITE: NEGATIVE
PH: 6.5 (ref 5.0–8.0)
Specific Gravity, Urine: 1.011 (ref 1.005–1.030)

## 2015-10-04 LAB — CBC
HCT: 21.9 % — ABNORMAL LOW (ref 39.0–52.0)
Hemoglobin: 7.3 g/dL — ABNORMAL LOW (ref 13.0–17.0)
MCH: 29.2 pg (ref 26.0–34.0)
MCHC: 33.3 g/dL (ref 30.0–36.0)
MCV: 87.6 fL (ref 78.0–100.0)
PLATELETS: 237 10*3/uL (ref 150–400)
RBC: 2.5 MIL/uL — AB (ref 4.22–5.81)
RDW: 13.8 % (ref 11.5–15.5)
WBC: 7.3 10*3/uL (ref 4.0–10.5)

## 2015-10-04 LAB — URINE MICROSCOPIC-ADD ON

## 2015-10-04 LAB — PROTIME-INR
INR: 1.17 (ref 0.00–1.49)
PROTHROMBIN TIME: 15.1 s (ref 11.6–15.2)

## 2015-10-04 LAB — OCCULT BLOOD, POC DEVICE: FECAL OCCULT BLD: NEGATIVE

## 2015-10-04 LAB — PROTEIN / CREATININE RATIO, URINE
CREATININE, URINE: 37.48 mg/dL
PROTEIN CREATININE RATIO: 6.75 mg/mg{creat} — AB (ref 0.00–0.15)
TOTAL PROTEIN, URINE: 253 mg/dL

## 2015-10-04 LAB — TROPONIN I: Troponin I: 0.96 ng/mL (ref <0.031)

## 2015-10-04 MED ORDER — HEPARIN SODIUM (PORCINE) 5000 UNIT/ML IJ SOLN
5000.0000 [IU] | Freq: Three times a day (TID) | INTRAMUSCULAR | Status: DC
  Administered 2015-10-04 – 2015-10-05 (×3): 5000 [IU] via SUBCUTANEOUS
  Filled 2015-10-04 (×3): qty 1

## 2015-10-04 NOTE — ED Notes (Signed)
MD at bedside. 

## 2015-10-04 NOTE — ED Provider Notes (Signed)
CSN: [id number]     Arrival date & time 10/04/15  0745 History   First MD Initiated Contact with Patient 10/04/15 0825     Chief Complaint  Patient presents with  . Generalized Body Aches     (Consider location/radiation/quality/duration/timing/severity/associated sxs/prior Treatment) HPI Patient reports that he's been having problems with generalized body aches that's been incremental in severity. He reports he has chronically increasing general fatigue and weakness. The past week has been worse. He reports his urine has gotten more dark. No chest pain, no abdominal pain, no vomiting or diarrhea. No increased lower extremity swelling. Mild shortness of breath with exertion. Patient reports with exertion he just becomes very fatigued and can't do very many activities. Patient's brother-in-law reports that the patient is very forgetful and often misses medication doses. Past Medical History  Diagnosis Date  . Hypertension   . Diabetes mellitus   . Hyperlipemia   . Arthritis   . Stroke ([place])   . Anemia   . Anxiety   . Asthma   . Blood transfusion without reported diagnosis   . Cancer [facility])     Was told he had prostate ca  . Depression   . GERD (gastroesophageal reflux disease)   . Chronic kidney disease    History reviewed. No pertinent past surgical history. Family History  Problem Relation Age of Onset  . Diabetes Father   . Diabetes Paternal Aunt    Social History  Substance Use Topics  . Smoking status: Never Smoker   . Smokeless tobacco: Never Used  . Alcohol Use: No    Review of Systems  10 Systems reviewed and are negative for acute change except as noted in the HPI.   Allergies  Penicillins  Home Medications   Prior to Admission medications   Medication Sig Start Date End Date Taking? Authorizing Provider  amLODipine (NORVASC) 10 MG tablet Take 1 tablet (10 mg total) by mouth daily. 09/08/12  Yes Calvert Cantor, MD  tamsulosin (FLOMAX) 0.4 MG CAPS capsule  Take 1 capsule (0.4 mg total) by mouth daily. 07/29/15  Yes Iline Oven, MD  nitroGLYCERIN (NITROSTAT) 0.4 MG SL tablet Place 0.4 mg under the tongue every 5 (five) minutes as needed for chest pain. Reported on 08/30/2015 05/30/15   Historical Provider, MD  pantoprazole (PROTONIX) 40 MG tablet Take 1 tablet (40 mg total) by mouth daily. Patient not taking: Reported on 10/02/2015 09/08/12   Calvert Cantor, MD   BP 147/80 mmHg  Pulse 76  Resp 11  SpO2 99% Physical Exam  Constitutional: He is oriented to person, place, and time. He appears well-developed and well-nourished.  Patient is nontoxic and alert. No acute respiratory distress.  HENT:  Head: Normocephalic and atraumatic.  Mouth/Throat: Oropharynx is clear and moist.  Eyes: EOM are normal. Pupils are equal, round, and reactive to light.  Neck: Neck supple.  Cardiovascular: Normal rate, regular rhythm, normal heart sounds and intact distal pulses.   Pulmonary/Chest: Effort normal and breath sounds normal.  Abdominal: Soft. Bowel sounds are normal. He exhibits no distension. There is no tenderness.  Musculoskeletal: Normal range of motion. He exhibits edema. He exhibits no tenderness.  Trace ankle edema. No acute wounds or lesions the feet.  Neurological: He is alert and oriented to person, place, and time. He has normal strength. Coordination normal. GCS eye subscore is 4. GCS verbal subscore is 5. GCS motor subscore is 6.  Skin: Skin is warm, dry and intact.  Psychiatric: He has a  normal mood and affect.     ED Course  Procedures (including critical care time) Labs Review Labs Reviewed  COMPREHENSIVE METABOLIC PANEL - Abnormal; Notable for the following:    CO2 18 (*)    Glucose, Bld 153 (*)    BUN 93 (*)    Creatinine, Ser 11.74 (*)    Calcium 7.8 (*)    Albumin 3.1 (*)    AST 9 (*)    ALT 8 (*)    GFR calc non Af Amer 4 (*)    GFR calc Af Amer 4 (*)    Anion gap 19 (*)    All other components within normal limits   CBC - Abnormal; Notable for the following:    RBC 2.50 (*)    Hemoglobin 7.3 (*)    HCT 21.9 (*)    All other components within normal limits  URINALYSIS, ROUTINE W REFLEX MICROSCOPIC (NOT AT [facility]) - Abnormal; Notable for the following:    Glucose, UA 250 (*)    Hgb urine dipstick SMALL (*)    Protein, ur >300 (*)    All other components within normal limits  TROPONIN I - Abnormal; Notable for the following:    Troponin I 0.96 (*)    All other components within normal limits  URINE MICROSCOPIC-ADD ON - Abnormal; Notable for the following:    Squamous Epithelial / LPF 0-5 (*)    Bacteria, UA RARE (*)    All other components within normal limits  PROTIME-INR    Imaging Review No results found. I have personally reviewed and evaluated these images and lab results as part of my medical decision-making.   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 Confirmed by Johnney Killian, MD, Jeannie Done [id number]) on 10/04/2015 11:21:11 AM     Consult: Internal medicine teaching service for admission. Request nephrology consultation. Consultation: Nephrology consulted. MDM   Final diagnoses:  Anemia of chronic disease  Uremia  ESRD (end stage renal disease) ([place])  Weakness   Patient presents with general myalgia and fatigue. He has significantly increasing renal failure since his last admission. He also has anemia of chronic disease. At this time he will be brought into the hospital for observation and nephrology consultation.    Charlesetta Shanks, MD 10/04/15 [phone]

## 2015-10-04 NOTE — Progress Notes (Addendum)
I went back and spoke with the patient. He was oriented to person, place, time.  In outlining what was important to him, he described wanting to go to church every Sunday and being able to read the Bible at his leisure. He understands that the Bible outlines average lifetime is 70 years. He had reiterated to the nephrology team earlier that he would never want to go on dialysis after having seen his on to go through that experience. In further delineating his goals, he agreed he would not want interventions which would prohibit his ability to enjoy going to church and reading the Bible.  As such, he agreed to defer interventions like CPR and intubation. He had given [place] permission to speak with his sister, but our efforts to reach his sister were fruitless using the numbers listed in the system.  We will check labwork tomorrow morning per nephrology team to see if there are any interventions we might be able to do in the interest of comfort. We appreciate recommendations from the inpatient palliative care team.

## 2015-10-04 NOTE — ED Notes (Signed)
Pt reports generalized body aches x 1 week. Pt also reports that his urine has become much darker than usual. Pt alert x4. NAD at this time.

## 2015-10-04 NOTE — Consult Note (Signed)
[facility] Consult Note     Date: 10/04/2015                  Patient Name:  [name]  MRN: [id number]  DOB: September 05, 1940  Age / Sex: 75 y.o., male         PCP: Martinique, BETTY G, MD                 Service Requesting Consult: Emergency room                  Reason for Consult: AKI on CKD stage 5            Chief Complaint: fatigue/weakness  HPI:  Mr. Tiegs is a 75 y/o Caucasian male with a PMHx of HTN, T2DM, HLD, and previous CVA presenting with progressively worsening fatigue and weakness per the EDP.  Unfortunately for me, he is not a great historian and when I ask what brings him in, he states he's been worried about his blood sugars because he "knows its gotten to a critical stage."   On ROS, patient denies dysuria. He has a weak urine stream and urinary hesitancy. He told the EDP that his urine was getting darker, however he tells me it is getting "whiter." His appetite is poor, it started to decline approximately 1 month. No abdominal pain, N/V. His stools have been black for the last 4-5 days. No SOB. Some DOE which began for "quite a while." Endorses feet swelling x 1 month. Denies any NSAID use. Was referred to a   In the ED, the patient's Cr was noted to be 11.74 with a BUN 93, bicarb of 18.   Of note, during his last hospitalization in 12/16 for weakness and leg cramps, his Cr was 8.7 and K was 5.2. At that time a renal U/S revealed medical renal disease and an enlarged prostrate protruding into the bladder, however renal was not consulted as the patient was "adamant that he was not interested in HD."   We have been consulted to further evaluate and manage Mr. Bulman advanced CKD stage 5.  He has had progressive CKD dating back to at least 2011 and over the last 3 years has not taken his medications and has had a rapid decline in renal function.  He was admitted in December by the teaching service and was noted to have CKD stage 5, however he did not want to  pursue renal replacement therapy at that time and had not followed up after discharge until this admission.  Despite a significant increase in his Scr, he has had only a minor decrement in his eGFR (25m/min in December to 443mmin today), so I do not think that this is acute on chronic but rather progressive and now end-stage renal disease.  He has mild uremic symptoms and has profound anemia.  He has not taken care of himself for years and after learning about the commitment required for hemodialysis, does not think he would do that.   Past Medical History  Diagnosis Date  . Hypertension   . Diabetes mellitus   . Hyperlipemia   . Arthritis   . [place] ([facility]  . Anemia   . Anxiety   . Asthma   . [facility] transfusion without reported diagnosis   [facility]    Was told he had prostate ca  . Depression   . GERD (gastroesophageal reflux disease)   . Chronic kidney disease  History reviewed. No pertinent past surgical history.  Family History  Problem Relation Age of Onset  . Diabetes Father   . Diabetes Paternal Aunt    Social History:  reports that he has never smoked. He has never used smokeless tobacco. He reports that he does not drink alcohol or use illicit drugs.  Allergies:  Allergies  Allergen Reactions  . Penicillins Hives    Has patient had a PCN reaction causing immediate rash, facial/tongue/throat swelling, SOB or lightheadedness with hypotension: Yes Has patient had a PCN reaction causing severe rash involving mucus membranes or skin necrosis: No Has patient had a PCN reaction that required hospitalization No Has patient had a PCN reaction occurring within the last 10 years: No If all of the above answers are "NO", then may proceed with Cephalosporin use.      (Not in a hospital admission)  Results for orders placed or performed during the hospital encounter of 10/04/15 (from the past 48 hour(s))  Urinalysis, Routine w reflex microscopic (not at [facility])      Status: Abnormal   Collection Time: 10/04/15  9:34 AM  Result Value Ref Range   Color, Urine YELLOW YELLOW   APPearance CLEAR CLEAR   Specific Gravity, Urine 1.011 1.005 - 1.030   pH 6.5 5.0 - 8.0   Glucose, UA 250 (A) NEGATIVE mg/dL   Hgb urine dipstick SMALL (A) NEGATIVE   Bilirubin Urine NEGATIVE NEGATIVE   Ketones, ur NEGATIVE NEGATIVE mg/dL   Protein, ur >300 (A) NEGATIVE mg/dL   Nitrite NEGATIVE NEGATIVE   Leukocytes, UA NEGATIVE NEGATIVE  Urine microscopic-add on     Status: Abnormal   Collection Time: 10/04/15  9:34 AM  Result Value Ref Range   Squamous Epithelial / LPF 0-5 (A) NONE SEEN   WBC, UA 0-5 0 - 5 WBC/hpf   RBC / HPF 0-5 0 - 5 RBC/hpf   Bacteria, UA RARE (A) NONE SEEN  Troponin I     Status: Abnormal   Collection Time: 10/04/15  9:45 AM  Result Value Ref Range   Troponin I 0.96 (HH) <0.031 ng/mL    Comment:        POSSIBLE MYOCARDIAL ISCHEMIA. SERIAL TESTING RECOMMENDED. CRITICAL RESULT CALLED TO, READ BACK BY AND VERIFIED WITH: M.BEASLEY,RN 1111 10/04/15 CLARK,S   Protime-INR     Status: None   Collection Time: 10/04/15  9:45 AM  Result Value Ref Range   Prothrombin Time 15.1 11.6 - 15.2 seconds   INR 1.17 0.00 - 1.49  Comprehensive metabolic panel     Status: Abnormal   Collection Time: 10/04/15  9:46 AM  Result Value Ref Range   Sodium 138 135 - 145 mmol/L   Potassium 4.8 3.5 - 5.1 mmol/L   Chloride 101 101 - 111 mmol/L   CO2 18 (L) 22 - 32 mmol/L   Glucose, Bld 153 (H) 65 - 99 mg/dL   BUN 93 (H) 6 - 20 mg/dL   Creatinine, Ser 11.74 (H) 0.61 - 1.24 mg/dL   Calcium 7.8 (L) 8.9 - 10.3 mg/dL   Total Protein 6.6 6.5 - 8.1 g/dL   Albumin 3.1 (L) 3.5 - 5.0 g/dL   AST 9 (L) 15 - 41 U/L   ALT 8 (L) 17 - 63 U/L   Alkaline Phosphatase 84 38 - 126 U/L   Total Bilirubin 0.6 0.3 - 1.2 mg/dL   GFR calc non Af Amer 4 (L) >60 mL/min   GFR calc Af Amer 4 (L) >60 mL/min  Comment: (NOTE) The eGFR has been calculated using the CKD EPI equation. This  calculation has not been validated in all clinical situations. eGFR's persistently <60 mL/min signify possible Chronic Kidney Disease.    Anion gap 19 (H) 5 - 15  CBC     Status: Abnormal   Collection Time: 10/04/15  9:46 AM  Result Value Ref Range   WBC 7.3 4.0 - 10.5 K/uL   RBC 2.50 (L) 4.22 - 5.81 MIL/uL   Hemoglobin 7.3 (L) 13.0 - 17.0 g/dL   HCT 21.9 (L) 39.0 - 52.0 %   MCV 87.6 78.0 - 100.0 fL   MCH 29.2 26.0 - 34.0 pg   MCHC 33.3 30.0 - 36.0 g/dL   RDW 13.8 11.5 - 15.5 %   Platelets 237 150 - 400 K/uL   No results found.  Renal U/S 07/28/15:  Interval bilateral renal atrophy.  Interval increased echogenicity of both kidneys, compatible with medical renal disease without hydronephrosis. Enlarged prostate protruding into the bladder.   Review of Systems  Constitutional: Positive for malaise/fatigue. Negative for fever and chills.  HENT: Negative for congestion.   Eyes: Negative for blurred vision.  Respiratory: Negative for cough, hemoptysis and shortness of breath.   Cardiovascular: Positive for leg swelling. Negative for chest pain, palpitations and orthopnea.  Gastrointestinal: Positive for melena. Negative for heartburn, nausea, vomiting, abdominal pain, diarrhea, constipation and blood in stool.  Genitourinary: Negative for dysuria, urgency, frequency and hematuria.  Musculoskeletal: Negative for myalgias and joint pain.  Skin: Negative for rash.  Neurological: Positive for weakness. Negative for dizziness, focal weakness and headaches.       + asterixis  Endo/Heme/Allergies: Does not bruise/bleed easily.  Psychiatric/Behavioral: Positive for memory loss. Negative for depression.    Blood pressure 147/80, pulse 76, resp. rate 11, SpO2 99 %. Physical Exam  General: Lying in bed in NAD.  Eyes: Conjunctivae non-injected.  ENTM: Dry mucous membranes. Oropharynx clear. No nasal discharge.  Neck: Supple, no LAD Cardiovascular: RRR. No murmurs, rubs, or gallops noted.   Respiratory: No increased WOB. CTAB without wheezing, rhonchi, or crackles noted. Abdomen: +BS, soft, non-distended, non-tender.  MSK: Trace pitting edema noted. Rectal: No internal or external hemorrhoids noted. Very enlarged, irregular prostate. Brown stool  Skin: No rashes noted  Neuro: Alert. Slow to answer some questions. Oriented to person, place, and day of the week. Not oriented to situation or date (Jan, then Feb 1st, year "5").    Assessment/Plan AKI on CKD stage 5: Cr 2 months ago noted to be 8.7 (noted to be 2.56 in 01/2013) and was up to 11.74 with a BUN of 93 on arrival.  I suspect this is a combination of poorly controlled DM, HTN, and post-renal (obstruction from enlarged prostate). The patient shows signs of uremia with poor appetite over the last 1 month, however from speaking with the ED RN, the family did not feel like his mentation was significantly changed from baseline. Unable to contact any family for collateral information. Patient appears dry on exam. He is urinating and I cannot feel his bladde on exam. Hydration with IV sodium bicarb. Patient contemplating whether he would care to undergo HD in the future if necessary.  Renal panel and magnesium in the AM. C/s palliative care for goals of care.  Would consider c/s to urology inpatient given concerns for prostate cancer if he would like everything done   Metabolic bone disease: Will get a renal panel tomorrow and as well a PTH in the  future if he would like everything done.   Anemia: concerns for anemia of chronic kidney disease: will get iron panel.   Hypocalcemia: corrected calcium 8.1. Continue to monitor.    Prostate enlargement: Prostate enlarged and irregular on my exam. Noted to be enlarged on previous U/S with a PSA of 31.94. Patient never followed up with urology. Continue Flowmax.   Mild cognitive impairment per history: obtain working numbers from family to correlate current stated with baseline.    Diabetes: from previous notes should be on glipizide 51m (PCP faxing previous notes).   HTN: continue current regimen    CKathrine Cords MD CSpencerResident, PGY-2 10/04/2015, 12:32 PM    I have seen and examined this patient and agree with plan as outlined by Dr. DLorenso Courier  Mr. PFarehas had progressive CKD now stage 5 due to poorly controlled, long-standing diabetes mellitus and hypertension.  He is not interested in RRT and given his past history of nonadherence with medical therapy and follow up he would be best managed by Palliative Care/Hospice.  We will consult them but still have him watch educational videos about hemodialysis.  His social situation is also tenuous as he is currently living with his elderly sister who plans on downsizing and he would require new accommodations.  No urgent indication for dialysis at this time and would hold off treating his anemia or Urology evaluation pending final decision following Palliative Care discussion. JGovernor RooksColadonato,MD 10/04/2015 2:48 PM

## 2015-10-04 NOTE — H&P (Signed)
Date: 10/04/2015               Patient Name:  [name] MRN: [id number]  DOB: 09/04/1940 Age / Sex: 75 y.o., male   PCP: Betty G Martinique, MD         Medical Service: Internal Medicine Teaching Service         Attending Physician: Dr. Thayer Headings, MD    First Contact: Dr. Loleta Chance Pager: [phone]  Second Contact: Dr. Charlott Rakes Pager: [phone]       After Hours (After 5p/  First Contact Pager: [phone]  weekends / holidays): Second Contact Pager: [phone]   Chief Complaint: "My sugars have been bothering my feet."  History of Present Illness:  [name] is a 75 year old man with history of chronic kidney disease stage V, unstaged suspected prostate cancer, history of stroke, type 2 diabetes, and hypertension, presenting with confusion.  Back in December, he was hospitalized with progressively worsening creatinine up to8.5, thought to be from post renal obstruction given his large prostate and elevated PSA; however, renal ultrasound did not show hydronephrosis. He was also anemic to 7.3 with mixed anemia of chronic disease and iron deficiency. He was supposed to follow-up with urology and our clinic, but he did not follow up.  Today he says he "feels fine" and does not have any particular complaints, but felt his sugars have been getting out of control and are hurting his feet. He denies any confusion, and knows his name, the date, and the location. He denies any fevers, chest pain, shortness of breath, abdominal pain; review of systems was otherwise non-revealing.  In the emergency department this visit, his creatinie is now up to 11.7, BUN 93. He spoke to nephrology before we saw him and he does not wish to pursue dialysis and would like to talk to the palliative doctors. He reaffirmed this to [place]; his sister had dialysis and he does not want to go through this. He wants to go to church and enjoy the rest of his life.  Medications: Amlodipine 10mg  daily Tamsulosin 0.5mg   daily Glipizide 5mg  daily Pantoprazole 40mg  daily  Allergies: Penicillin causes urticaria  Past medical history: Unstaged prostate cancer Chronic kidney disease stage V Anemia of chronic disease  Family history: Diabetes in father and paternal aunt No history of cancer  Social history: He lives with his sister in the area He worked in the [employer] He drank as a teenager but does not drink anymore He does not and has never smoked  Review of systems: Per HPI  Physical Exam: Blood pressure 147/80, pulse 76, temperature 97.7 F (36.5 C), temperature source Oral, resp. rate 11, SpO2 99 %. General: pale, confused but friendly man with flight of ideas, crying at times HEENT: no scleral icterus, extra-ocular muscles intact, oropharynx without lesions Cardiac: regular rate and rhythm, no rubs, murmurs or gallops Pulm: breathing well, clear to auscultation bilaterally Abd: bowel sounds normal, soft, nondistended, non-tender Ext: warm and well perfused, without pedal edema Lymph: no cervical or supraclavicular lymphadenopathy Skin: no rash, hair, or nail changes Neuro: asterixis, alert and oriented X3, cranial nerves II-XII grossly intact, moving all extremities well  Lab results: Basic Metabolic Panel:  Recent Labs  10/04/15 0946  NA 138  K 4.8  CL 101  CO2 18*  GLUCOSE 153*  BUN 93*  CREATININE 11.74*  CALCIUM 7.8*   Liver Function Tests:  Recent Labs  10/04/15 0946  AST 9*  ALT  8*  ALKPHOS 84  BILITOT 0.6  PROT 6.6  ALBUMIN 3.1*   CBC:  Recent Labs  10/04/15 0946  WBC 7.3  HGB 7.3*  HCT 21.9*  MCV 87.6  PLT 237   Cardiac Enzymes:  Recent Labs  10/04/15 0945  TROPONINI 0.96*   Coagulation:  Recent Labs  10/04/15 0945  LABPROT 15.1  INR 1.17   Urinalysis:  Recent Labs  10/04/15 0934  COLORURINE YELLOW  LABSPEC 1.011  PHURINE 6.5  GLUCOSEU 250*  HGBUR SMALL*  BILIRUBINUR NEGATIVE  KETONESUR NEGATIVE  PROTEINUR >300*   NITRITE NEGATIVE  LEUKOCYTESUR NEGATIVE   Other results: EKG: normal sinus rhythm, RBBB, unchanged from previous  Assessment & Plan by Problem:  Chronic kidney disease stage V not on dialysis: His kidney function is now end-stage and he would not like to pursue dialysis. I'm not sure of the cause of his kidney failure but I do not think this was post-obstructive as he did not have hydronephrosis last admission. His potassium and bicarbonate are surprisingly normal. He's confused from the uremia with flight of ideas but he's oriented and I feel he has the capacity to make his own decisions. Palliative care discussion is the most crucial part of this hospitalization so we'll pend further work-up pending that conversation. Disposition will also be difficult as he lives with his elderly sister. -Consulted palliative care  Anemia of chronic disease: Hemoglobin 7.3; iron labs in the past showed mixed iron-deficiency and anemia of chronic disease.  Troponinemia: Troponin 0.96. He denies any chest pain and EKG shows only an old right-bundle branch block. This is likely falsely up from renal failure.  Dispo: Disposition is deferred at this time, awaiting evaluation from palliative care.  The patient does have a current PCP (Betty G Martinique, MD) and does need an [facility] hospital follow-up appointment after discharge.  The patient does have transportation limitations that hinder transportation to clinic appointments.  Signed: Loleta Chance, MD 10/04/2015, 2:02 PM

## 2015-10-05 ENCOUNTER — Encounter (HOSPITAL_COMMUNITY): Payer: Self-pay | Admitting: *Deleted

## 2015-10-05 DIAGNOSIS — Z66 Do not resuscitate: Secondary | ICD-10-CM

## 2015-10-05 DIAGNOSIS — E1122 Type 2 diabetes mellitus with diabetic chronic kidney disease: Secondary | ICD-10-CM | POA: Diagnosis not present

## 2015-10-05 DIAGNOSIS — N186 End stage renal disease: Secondary | ICD-10-CM | POA: Diagnosis not present

## 2015-10-05 DIAGNOSIS — I120 Hypertensive chronic kidney disease with stage 5 chronic kidney disease or end stage renal disease: Secondary | ICD-10-CM

## 2015-10-05 DIAGNOSIS — D631 Anemia in chronic kidney disease: Secondary | ICD-10-CM | POA: Diagnosis not present

## 2015-10-05 DIAGNOSIS — Z515 Encounter for palliative care: Secondary | ICD-10-CM

## 2015-10-05 DIAGNOSIS — R7989 Other specified abnormal findings of blood chemistry: Secondary | ICD-10-CM

## 2015-10-05 LAB — GLUCOSE, CAPILLARY: GLUCOSE-CAPILLARY: 77 mg/dL (ref 65–99)

## 2015-10-05 MED ORDER — TAMSULOSIN HCL 0.4 MG PO CAPS: 0.4000 mg | ORAL_CAPSULE | Freq: Every day | ORAL | Status: DC

## 2015-10-05 MED ORDER — TAMSULOSIN HCL 0.4 MG PO CAPS
0.4000 mg | ORAL_CAPSULE | Freq: Every day | ORAL | Status: DC
  Administered 2015-10-05: 0.4 mg via ORAL
  Filled 2015-10-05: qty 1

## 2015-10-05 MED ORDER — AMLODIPINE BESYLATE 10 MG PO TABS
10.0000 mg | ORAL_TABLET | Freq: Every day | ORAL | Status: DC
  Administered 2015-10-05: 10 mg via ORAL
  Filled 2015-10-05: qty 1

## 2015-10-05 NOTE — Progress Notes (Signed)
Patient ID: [name], male   DOB: Dec 24, 1940, 75 y.o.   MRN: [id number]   Subjective: [name] is feeling well today. He reaffirmed he is happy with his life and does not want to pursue dialysis. He'd like to go home to be comfortable where he can go to church.  Objective: Vital signs in last 24 hours: Filed Vitals:   10/04/15 2130 10/05/15 0049 10/05/15 0513 10/05/15 1133  BP: 154/83 149/81 140/73 170/92  Pulse: 89 79 81 80  Temp: 98.4 F (36.9 C) 97.9 F (36.6 C) 97.4 F (36.3 C) 97.6 F (36.4 C)  TempSrc: Oral Oral Oral Oral  Resp: 18  16 16   Height:      Weight:   76.431 kg (168 lb 8 oz)   SpO2: 100% 98% 95% 97%   Physical exam: General: pale, friendly man with tangential though process and labile affect as he cries  Cardiac: regular rate and rhythm, no rubs, murmurs or gallops Pulm: breathing well, clear to auscultation bilaterally Ext: warm and well perfused, without pedal edema Neuro: asterixis, alert and oriented X3, moving all extremities well  Medications: I have reviewed the patient's current medications. Scheduled Meds: . tamsulosin  0.4 mg Oral Daily   Continuous Infusions:  PRN Meds:.  Assessment/Plan:  Chronic kidney disease stage V not on dialysis: His kidney function is now end-stage and he would not like to pursue dialysis. He has the capacity to make his own decisions and is clear he wants to have home hospice where he can attend church. He had been on hospice in the past. We'll call care management to ensure this happens, and follow-up palliative care's recommendations. I've stopped his heparin and amlodipine, but will continue tamsulosin to make him comfortable. -Thanks care management and palliative care for your help  Dispo: Disposition is deferred at this time, awaiting home hospice set-up.   The patient does have a current PCP (Edwin G Martinique, MD) and does need an [facility] hospital follow-up appointment after discharge.  The patient does have  transportation limitations that hinder transportation to clinic appointments.  .Services Needed at time of discharge: Y = Yes, Blank = No PT:   OT:   RN:   Equipment:   Other:    Edwin Chance, MD 10/05/2015, 11:48 AM

## 2015-10-05 NOTE — Progress Notes (Signed)
Chaplain attempted visit with patient, at the time of this visit the patient was sleeping. Chaplain will follow up at a later time. Dewitt Hoes [phone]

## 2015-10-05 NOTE — Progress Notes (Signed)
[facility] Progress Note    Assessment/ Plan:   CKD stage 5 not on dialysis: Cr 2 months ago noted to be 8.7 (noted to be 2.56 in 01/2013) and was up to 11.74 with a BUN of 93 on arrival which isn't a significant change from 2 months ago. I suspect this is a combination of poorly controlled DM and HTN. His enlarged prostate may be contributing as well. The patient shows signs of uremia with poor appetite over the last 1 month, however from speaking with the ED RN, the family did not feel like his mentation was significantly changed from baseline. The patient has opted for DNR, has not yet decided on comfort care. Given his h/o noncompliance I do not think he would be a great dialysis candidate, nor does it seem like he is interested in this. Follow up on palliative discussions. Continue to monitor renal function. If he opts to comfort care, d/c all labs.   Metabolic bone disease:  If aggressive measures, would need to treat hyperphosphatemia if present. Could also consider ordering iPTH if he would like aggressive measures.  Anemia: concerns for anemia of chronic kidney disease, iron panel was cancelled by primary team. Will hold off on evaluation until palliative has a chance to discuss with the patient.    Hypocalcemia: corrected calcium 8.1 on admission. Continue to monitor.   Prostate enlargement: Prostate enlarged and irregular on my exam. Noted to be enlarged on previous U/S with a PSA of 31.94. Patient never followed up with urology. I would continue Flowmax even if the patient opts to go the comfort care route, as urinary retention can be quite uncomfortable. .   Diabetes: from previous notes should be on glipizide 5mg .   HTN: continue current regimen. BPs 160-140s/90-80s.   Subjective:   Patient in no pain. Intermittently tearful about how he didn't take care of himself. Breathing comfortably. No issues with urination. He has not yet read the videos on HD.     Objective:   BP 140/73 mmHg  Pulse 81  Temp(Src) 97.4 F (36.3 C) (Oral)  Resp 16  Ht 5\' 9"  (1.753 m)  Wt 168 lb 8 oz (76.431 kg)  BMI 24.87 kg/m2  SpO2 95%  Intake/Output Summary (Last 24 hours) at 10/05/15 [id number] Last data filed at 10/05/15 0800  Gross per 24 hour  Intake      0 ml  Output   1805 ml  Net  -1805 ml   Weight change:   Physical Exam: General: Lying in bed in NAD, intermittently tearful.  Eyes: Conjunctivae non-injected.  ENTM: Dry mucous membranes. Oropharynx clear. No nasal discharge.  Neck: Supple, no LAD Cardiovascular: RRR. No murmurs, rubs, or gallops noted.  Respiratory: No increased WOB. CTAB without wheezing, rhonchi, or crackles noted. Abdomen: +BS, soft, non-distended, non-tender.  MSK: Trace pitting edema noted. Skin: No rashes noted  Neuro/psych: Alert, very talkative and sometimes tangential in thougts.  Slow to answer some questions. Oriented to person, place, and day of the week. Asterixis noted.  Imaging: No results found.  Labs: BMET  Recent Labs Lab 10/04/15 0946  NA 138  K 4.8  CL 101  CO2 18*  GLUCOSE 153*  BUN 93*  CREATININE 11.74*  CALCIUM 7.8*   CBC  Recent Labs Lab 10/04/15 0946  WBC 7.3  HGB 7.3*  HCT 21.9*  MCV 87.6  PLT 237    Medications:    . heparin  5,000 Units Subcutaneous 3 times per day  Kathrine Cords, MD [place] Resident, PGY-2 10/05/2015, 8:33 AM  I have seen and examined this patient and agree with plan per Dr Lorenso Courier.  I had a long dscussion with pt regarding HD and he is adamant that he does not want to do it.  He is aware that this will result in a painless death in the near future. Palliative care should be consulted.  I have little to offer at this time so will sign off. Dicy Smigel T,MD 10/05/2015 10:31 AM

## 2015-10-05 NOTE — Care Management Note (Signed)
Case Management Note  Patient Details  Name: [name] MRN: [id number] Date of Birth: 1941/04/11  Subjective/Objective:           Admitted with Anemia         Action/Plan: Patient lives at home with his sister [name] [phone]). Patient is to return home with hospice care. Home hospice choice offered, pt chose [facility], Bambi with [facility] called for arrangements. Patient gave CM permission to talk to his sister. TCT Peggy regarding DCP; She is agreeable to home hospice care / [facility], DME to be delivered to the home tomorrow.  Expected Discharge Date:    10/05/2015              Expected Discharge Plan:  [place] Referral:   Palliative Care   Discharge planning Services  CM Consult  Choice offered to:  Patient [facility] Agency:   [facility]  Status of Service:  Completed, signed off  Sherrilyn Rist [id number] 10/05/2015, 2:32 PM

## 2015-10-05 NOTE — Consult Note (Signed)
   [facility] CM Inpatient Consult   10/05/2015  [name] August 01, 1941 [id number]  Active patient:  Patient is currently active with [facility] for chronic disease management services.  Patient has been engaged by a [employer] and CSW.  Our community based plan of care has focused on disease management and community resource support. Patient and son were suppose to be seeking an Assisted Livingfacilities prior to coming to the hospital.  Please see [facility] notes for details.  [facility] will continue to follow and  will receive a post discharge transition of care call and will be evaluated for monthly home visits for assessments and disease process education, depending on his progression and disposition, as appropriate.  Made Inpatient Case Manager aware that [facility] following.  Of note, [facility] Care Management services does not replace or interfere with any services that are needed or arranged by inpatient case management or social work.  For additional questions or referrals please contact: Natividad Brood, RN BSN [facility] Liaison  [phone] business mobile phone Toll free office [phone]

## 2015-10-05 NOTE — Progress Notes (Signed)
Orders received for pt discharge.  Discharge summary printed and reviewed with pt.  Explained medication regimen, and pt had no further questions at this time.  IV removed and site remains clean, dry, intact.  Telemetry removed.  Pt in stable condition and awaiting transport. 

## 2015-10-05 NOTE — Consult Note (Signed)
Consultation Note Date: 10/05/2015   Patient Name: [name]  DOB: [birth year]  MRN: [id number]  Age / Sex: 75 y.o., male  PCP: Betty G Martinique, MD Referring Physician: Thayer Headings, MD  Reason for Consultation: Establishing goals of care, Hospice Evaluation and Psychosocial/spiritual support  Clinical Assessment/Narrative:  75 year old man with history of chronic kidney disease stage V, unstaged suspected prostate cancer, history of stroke, type 2 diabetes, and hypertension, presenting with confusion.  Continued slow physical and functional decline.  Patient is faced with advanced directives and anticipatory   Back in December, he was hospitalized with progressively worsening creatinine up to 8.5, thought to be from post renal obstruction given his large prostate and elevated PSA; however, renal ultrasound did not show hydronephrosis. He was also anemic to 7.3 with mixed anemia of chronic disease and iron deficiency.  Non compliant with  follow-ups  Creatinie is now up to 11.7, BUN 93, faced with treatment options and decsions    This NP Wadie Lessen reviewed medical records, received report from team, assessed the patient and then meet at the patient's bedside  to discuss diagnosis, prognosis, GOC, EOL wishes disposition and options. I then spoke to son Arye Espejo # [phone], sister Vickii Chafe # [phone] concerning the same.   A detailed discussion was had today regarding advanced directives.  Concepts specific to code status, artifical feeding and hydration, continued IV antibiotics and rehospitalization was had.  The difference between a aggressive medical intervention path  and a palliative comfort care path for this patient at this time was had.  Values and goals of care important to patient and family were attempted to be elicited.  Concept of Hospice and Palliative Care were discussed  Natural  trajectory and expectations at EOL were discussed.  Questions and concerns addressed.   Family encouraged to call with questions or concerns.  PMT will continue to support holistically.   Primary Decision Maker: Patient himself with support of his family    HCPOA: none documented     SUMMARY OF RECOMMENDATIONS  - comfort, quality and dignity -no life prolonging interventions, NO dialysis -home with hospice and transition to hospice facility when eligible  Code Status/Advance Care Planning: DNR    Code Status Orders        Start     Ordered   10/04/15 1919  Do not attempt resuscitation (DNR)   Continuous    Question Answer Comment  In the event of cardiac or respiratory ARREST Do not call a "code blue"   In the event of cardiac or respiratory ARREST Do not perform Intubation, CPR, defibrillation or ACLS   In the event of cardiac or respiratory ARREST Use medication by any route, position, wound care, and other measures to relive pain and suffering. May use oxygen, suction and manual treatment of airway obstruction as needed for comfort.      10/04/15 1918    Code Status History    Date Active Date Inactive Code Status Order ID Comments User Context   10/04/2015  3:42 PM 10/04/2015  7:18 PM Full Code [id number]  Riccardo Dubin, MD Inpatient   10/04/2015  3:41 PM 10/04/2015  3:42 PM Full Code [id number]  Riccardo Dubin, MD Inpatient   07/28/2015  2:06 PM 07/29/2015  9:51 PM Full Code [id number]  Alexa Sherral Hammers, MD Inpatient      Other Directives:None   Palliative Prophylaxis:   Bowel Regimen, Frequent Pain Assessment and Oral Care  Additional Recommendations (Limitations,  Scope, Preferences):   Full Comfort Care, avoid rehospitalization    Psycho-social/Spiritual:  Support System: Poor Desire for further Chaplaincy support:no Additional Recommendations: Education on Hospice  Prognosis: weeks to months  Discharge Planning: Home with Hospice, will write for  choice   Chief Complaint/ Primary Diagnoses: Present on Admission:  . Anemia  I have reviewed the medical record, interviewed the patient and family, and examined the patient. The following aspects are pertinent.  Past Medical History  Diagnosis Date  . Hypertension   . Diabetes mellitus   . Hyperlipemia   . Arthritis   . Stroke ([place])   . Anemia   . Anxiety   . Asthma   . Blood transfusion without reported diagnosis   . Cancer [facility])     Was told he had prostate ca  . Depression   . GERD (gastroesophageal reflux disease)   . Chronic kidney disease    Social History   Social History  . Marital Status: Divorced    Spouse Name: N/A  . Number of Children: N/A  . Years of Education: N/A   Social History Main Topics  . Smoking status: Never Smoker   . Smokeless tobacco: Never Used  . Alcohol Use: No  . Drug Use: No  . Sexual Activity: No   Other Topics Concern  . None   Social History Narrative   Family History  Problem Relation Age of Onset  . Diabetes Father   . Diabetes Paternal Aunt    Scheduled Meds: . amLODipine  10 mg Oral Daily  . heparin  5,000 Units Subcutaneous 3 times per day  . tamsulosin  0.4 mg Oral Daily   Continuous Infusions:  PRN Meds:. Medications Prior to Admission:  Prior to Admission medications   Medication Sig Start Date End Date Taking? Authorizing Provider  amLODipine (NORVASC) 10 MG tablet Take 1 tablet (10 mg total) by mouth daily. 09/08/12  Yes Calvert Cantor, MD  tamsulosin (FLOMAX) 0.4 MG CAPS capsule Take 1 capsule (0.4 mg total) by mouth daily. 07/29/15  Yes Iline Oven, MD  nitroGLYCERIN (NITROSTAT) 0.4 MG SL tablet Place 0.4 mg under the tongue every 5 (five) minutes as needed for chest pain. Reported on 08/30/2015 05/30/15   Historical Provider, MD   Allergies  Allergen Reactions  . Penicillins Hives    Has patient had a PCN reaction causing immediate rash, facial/tongue/throat swelling, SOB or lightheadedness with  hypotension: Yes Has patient had a PCN reaction causing severe rash involving mucus membranes or skin necrosis: No Has patient had a PCN reaction that required hospitalization No Has patient had a PCN reaction occurring within the last 10 years: No If all of the above answers are "NO", then may proceed with Cephalosporin use.     Review of Systems  Constitutional: Positive for activity change, appetite change and fatigue.    Physical Exam  Constitutional: He is oriented to person, place, and time. He is cooperative. He appears ill.  frail  Cardiovascular: Normal rate, regular rhythm and normal heart sounds.   Respiratory: He has decreased breath sounds in the right lower field and the left lower field.  GI: He exhibits distension.  Musculoskeletal:       Right shoulder: He exhibits decreased strength.  -weakness  Neurological: He is alert and oriented to person, place, and time.  Skin: Skin is warm and dry.    Vital Signs: BP 140/73 mmHg  Pulse 81  Temp(Src) 97.4 F (36.3 C) (Oral)  Resp 16  Ht 5\' 9"  (1.753 m)  Wt 76.431 kg (168 lb 8 oz)  BMI 24.87 kg/m2  SpO2 95%  SpO2: SpO2: 95 % O2 Device:SpO2: 95 % O2 Flow Rate: .   IO: Intake/output summary:  Intake/Output Summary (Last 24 hours) at 10/05/15 1111 Last data filed at 10/05/15 1106  Gross per 24 hour  Intake    415 ml  Output   1806 ml  Net  -1391 ml    LBM: Last BM Date:  (pt unsure) Baseline Weight: Weight: 76.567 kg (168 lb 12.8 oz) Most recent weight: Weight: 76.431 kg (168 lb 8 oz) (a scale)      Palliative Assessment/Data:  Flowsheet Rows        Most Recent Value   Intake Tab    Referral Department  Nephrology   Unit at Time of Referral  Cardiac/Telemetry Unit   Palliative Care Primary Diagnosis  Nephrology   Date Notified  10/04/15   Palliative Care Type  New Palliative care   Reason for referral  Clarify Goals of Care   Date of Admission  10/04/15   # of days IP prior to Palliative referral  0    Clinical Assessment    Psychosocial & Spiritual Assessment    Palliative Care Outcomes       Additional Data Reviewed:  CBC:    Component Value Date/Time   WBC 7.3 10/04/2015 0946   HGB 7.3* 10/04/2015 0946   HCT 21.9* 10/04/2015 0946   PLT 237 10/04/2015 0946   MCV 87.6 10/04/2015 0946   NEUTROABS 6.2 08/18/2012 1730   LYMPHSABS 1.4 08/18/2012 1730   MONOABS 0.8 08/18/2012 1730   EOSABS 0.2 08/18/2012 1730   BASOSABS 0.1 08/18/2012 1730   Comprehensive Metabolic Panel:    Component Value Date/Time   NA 138 10/04/2015 0946   K 4.8 10/04/2015 0946   CL 101 10/04/2015 0946   CO2 18* 10/04/2015 0946   BUN 93* 10/04/2015 0946   CREATININE 11.74* 10/04/2015 0946   GLUCOSE 153* 10/04/2015 0946   CALCIUM 7.8* 10/04/2015 0946   AST 9* 10/04/2015 0946   ALT 8* 10/04/2015 0946   ALKPHOS 84 10/04/2015 0946   BILITOT 0.6 10/04/2015 0946   PROT 6.6 10/04/2015 0946   ALBUMIN 3.1* 10/04/2015 0946     Time In: 1100 Time Out: 1230 Time Total: 90  min Greater than 50%  of this time was spent counseling and coordinating care related to the above assessment and plan.  Signed by: Wadie Lessen, NP  Knox Royalty, NP  10/05/2015, 11:11 AM  Please contact Palliative Medicine Team phone at [phone] for questions and concerns.

## 2015-10-05 NOTE — Discharge Summary (Signed)
Name: [name] MRN: [id number] DOB: 1941-02-28 75 y.o. PCP: Betty G Martinique, MD  Date of Admission: 10/04/2015  7:46 AM Date of Discharge: 10/05/2015 Attending Physician: Thayer Headings, MD  Discharge Diagnosis: 1. Progression to chronic kidney disease stage V, not in dialysis  Discharge Medications:    Medication List    STOP taking these medications        amLODipine 10 MG tablet  Commonly known as:  NORVASC     nitroGLYCERIN 0.4 MG SL tablet  Commonly known as:  NITROSTAT      TAKE these medications        tamsulosin 0.4 MG Caps capsule  Commonly known as:  FLOMAX  Take 1 capsule (0.4 mg total) by mouth daily.        Disposition and follow-up:   Mr.Jessie Spadafore was discharged from [facility] in Stable condition.  At the hospital follow up visit please address:  1.  He is followed by home hospice and is comfortable  Consultations:  Treatment Team:  Donato Heinz, MD Palliative [facility]  Admission HPI:   [name] is a 75 year old man with history of chronic kidney disease stage V, unstaged suspected prostate cancer, history of stroke, type 2 diabetes, and hypertension, presenting with confusion.  Back in December, he was hospitalized with progressively worsening creatinine up to8.5, thought to be from post renal obstruction given his large prostate and elevated PSA; however, renal ultrasound did not show hydronephrosis. He was also anemic to 7.3 with mixed anemia of chronic disease and iron deficiency. He was supposed to follow-up with urology and our clinic, but he did not follow up.  Today he says he "feels fine" and does not have any particular complaints, but felt his sugars have been getting out of control and are hurting his feet. He denies any confusion, and knows his name, the date, and the location. He denies any fevers, chest pain, shortness of breath, abdominal pain; review of systems was otherwise non-revealing.  In the  emergency department this visit, his creatinie is now up to 11.7, BUN 93. He spoke to nephrology before we saw him and he does not wish to pursue dialysis and would like to talk to the palliative doctors. He reaffirmed this to [place]; his sister had dialysis and he does not want to go through this. He wants to go to church and enjoy the rest of his life.  Hospital Course by problem list:   1. Progression to chronic kidney disease stage V not in dialysis: He presented with uremia and markedly elevated creatinine and did not wish to pursue dialysis. He told us he preferred to go home where he can attend church and die peacefully. He was discharged home with home hospice. We stopped his amlodipine but continued the tamsulosin to prevent obstruction given his enlarged prostate from likely prostate cancer.  Discharge Vitals:   BP 170/92 mmHg  Pulse 80  Temp(Src) 97.6 F (36.4 C) (Oral)  Resp 16  Ht 5\' 9"  (1.753 m)  Wt 76.431 kg (168 lb 8 oz)  BMI 24.87 kg/m2  SpO2 97%  Discharge Labs:  Results for orders placed or performed during the hospital encounter of 10/04/15 (from the past 24 hour(s))  Occult blood, poc device     Status: None   Collection Time: 10/04/15  1:40 PM  Result Value Ref Range   Fecal Occult Bld NEGATIVE NEGATIVE  Glucose, capillary     Status: None  Collection Time: 10/05/15  8:25 AM  Result Value Ref Range   Glucose-Capillary 77 65 - 99 mg/dL    Signed: Loleta Chance, MD 10/08/2015, 12:06 PM

## 2015-10-05 NOTE — Progress Notes (Signed)
Pt refused lab draws this am

## 2015-10-08 ENCOUNTER — Other Ambulatory Visit: Payer: Self-pay | Admitting: *Deleted

## 2015-10-08 NOTE — Patient Outreach (Addendum)
[facility]) Care Management  10/08/2015  [name] 1942/06/74 [id number]   S:  Pt had ED visit and admission this weekend. His brother in law took him because he didin't feel good. Although pt has neglected his health all his life, he decided he wanted to do this. I had just seen him on 10/02/15. He was stable.  I had several telephone conversations with his son, [name] over the weekend regarding his plan of care. Pt is opting for Hospice services.   O:  BP 170/100 mmHg  Pulse 78  Resp 18  SpO2 98%       RRR       Lungs clear       Abdomen with bowel sounds present all quads.       No edema       Foot condition post debridement of calluses is normal - no signs of infection       Corn remains on R 4th lateral toe.  A:   End stage kidney disease       Hard corn - R 4th toe  P:   Pt, son and I discussed what to expect with the Hospice service. We will still move forward on trying to find placement for Mr. Tarwater for supervisory reasons. I will stay involved until pt is placed. I will fill out his PASSAR form and get MD signature. I have done the [facility] and will get MD signature.  I pared down the corn on his 4th R toe.  I will see pt in 2 weeks.  Deloria Lair [facility] [place] [phone]

## 2015-10-16 ENCOUNTER — Other Ambulatory Visit: Payer: Self-pay | Admitting: *Deleted
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
## 2015-10-18 ENCOUNTER — Other Ambulatory Visit: Payer: Self-pay | Admitting: *Deleted

## 2015-10-18 NOTE — Patient Outreach (Signed)
I called and spoke to Dr. Martinique this evening about the plan of care for [name]. She explained she would be very leary to prescribe depakote when the pt has not been on any lesser forms of meds (an antidepressant) alone. She agrees she has seen some signs of depression and would not be opposed to start an SSRI like paroxetine 20 mg. She has not seen the note I sent yesterday, which I will send again in the morning 10/18/15).  In thinking about [name] complaints, he has mentioned, not sleeping well. I am thinking perhaps, mirtazapine 15 mg may be the medication we select as this will not only help with his depressive sxs but also may help him to sleep.  I will talk to Dr. Martinique again about this. I will also relay this information to Delmer Islam, CSW, for Hospice.  Deloria Lair [facility] [place] [phone]

## 2015-11-01 ENCOUNTER — Encounter: Payer: Self-pay | Admitting: *Deleted

## 2015-11-01 ENCOUNTER — Other Ambulatory Visit: Payer: Self-pay | Admitting: *Deleted

## 2015-11-01 ENCOUNTER — Emergency Department (HOSPITAL_COMMUNITY)
Admission: EM | Admit: 2015-11-01 | Discharge: 2015-11-01 | Disposition: A | Discharge status: Home / Self Care | Payer: Medicare Other | Source: Home / Self Care | Attending: Emergency Medicine | Admitting: Emergency Medicine

## 2015-11-01 ENCOUNTER — Encounter (HOSPITAL_COMMUNITY): Payer: Self-pay | Admitting: *Deleted

## 2015-11-01 DIAGNOSIS — E119 Type 2 diabetes mellitus without complications: Secondary | ICD-10-CM

## 2015-11-01 DIAGNOSIS — E1122 Type 2 diabetes mellitus with diabetic chronic kidney disease: Secondary | ICD-10-CM | POA: Diagnosis present

## 2015-11-01 DIAGNOSIS — Z8546 Personal history of malignant neoplasm of prostate: Secondary | ICD-10-CM | POA: Insufficient documentation

## 2015-11-01 DIAGNOSIS — Z515 Encounter for palliative care: Secondary | ICD-10-CM

## 2015-11-01 DIAGNOSIS — I120 Hypertensive chronic kidney disease with stage 5 chronic kidney disease or end stage renal disease: Secondary | ICD-10-CM

## 2015-11-01 DIAGNOSIS — Z880 Allergy status to penicillin: Secondary | ICD-10-CM

## 2015-11-01 DIAGNOSIS — N186 End stage renal disease: Secondary | ICD-10-CM | POA: Diagnosis present

## 2015-11-01 DIAGNOSIS — Z79899 Other long term (current) drug therapy: Secondary | ICD-10-CM

## 2015-11-01 DIAGNOSIS — J9621 Acute and chronic respiratory failure with hypoxia: Secondary | ICD-10-CM

## 2015-11-01 DIAGNOSIS — Z8739 Personal history of other diseases of the musculoskeletal system and connective tissue: Secondary | ICD-10-CM | POA: Insufficient documentation

## 2015-11-01 DIAGNOSIS — J45901 Unspecified asthma with (acute) exacerbation: Secondary | ICD-10-CM

## 2015-11-01 DIAGNOSIS — F419 Anxiety disorder, unspecified: Secondary | ICD-10-CM

## 2015-11-01 DIAGNOSIS — R140 Abdominal distension (gaseous): Secondary | ICD-10-CM

## 2015-11-01 DIAGNOSIS — R410 Disorientation, unspecified: Secondary | ICD-10-CM

## 2015-11-01 DIAGNOSIS — R0902 Hypoxemia: Secondary | ICD-10-CM | POA: Diagnosis present

## 2015-11-01 DIAGNOSIS — F329 Major depressive disorder, single episode, unspecified: Secondary | ICD-10-CM

## 2015-11-01 DIAGNOSIS — Z8673 Personal history of transient ischemic attack (TIA), and cerebral infarction without residual deficits: Secondary | ICD-10-CM | POA: Insufficient documentation

## 2015-11-01 DIAGNOSIS — R000 Tachycardia, unspecified: Secondary | ICD-10-CM

## 2015-11-01 DIAGNOSIS — Z8719 Personal history of other diseases of the digestive system: Secondary | ICD-10-CM | POA: Insufficient documentation

## 2015-11-01 DIAGNOSIS — Z862 Personal history of diseases of the blood and blood-forming organs and certain disorders involving the immune mechanism: Secondary | ICD-10-CM | POA: Insufficient documentation

## 2015-11-01 MED ORDER — LORAZEPAM 1 MG PO TABS
1.0000 mg | ORAL_TABLET | Freq: Once | ORAL | Status: AC
  Administered 2015-11-01: 1 mg via ORAL
  Filled 2015-11-01: qty 1

## 2015-11-01 MED ORDER — IPRATROPIUM-ALBUTEROL 0.5-2.5 (3) MG/3ML IN SOLN
3.0000 mL | Freq: Once | RESPIRATORY_TRACT | Status: AC
  Administered 2015-11-01: 3 mL via RESPIRATORY_TRACT
  Filled 2015-11-01: qty 3

## 2015-11-01 NOTE — ED Notes (Signed)
Received call from Lucianne Lei, Hospice RN stating "I just spoke with the family & his oxygen and all his supplies are at the home."

## 2015-11-01 NOTE — ED Provider Notes (Signed)
CSN: [id number]     Arrival date & time 11/01/15  1627 History   First MD Initiated Contact with Patient 11/01/15 1641     Chief Complaint  Patient presents with  . Shortness of Breath     (Consider location/radiation/quality/duration/timing/severity/associated sxs/prior Treatment) HPI Patient was sent from home for Comfort Care. Patient is in hospice and cared for by Dr. Marco Collie. She called to advise me that the patient had an episode of respiratory distress and  She felt that the patient may have aspirated. They did not have supplemental oxygen are adequate pain medication to administer. At this time the patient has been sent to the emergency department for comfort care and plan for hospice nurse to get the patient comfort medications and home supplies needed this evening. The patient does not recall having had an episode of significantly worse difficulty breathing. At this time he feels that he is at about his baseline. He is denying immediate pain. Past Medical History  Diagnosis Date  . Hypertension   . Diabetes mellitus   . Hyperlipemia   . Arthritis   . Stroke ([place])   . Anemia   . Anxiety   . Asthma   . Blood transfusion without reported diagnosis   . Cancer [facility])     Was told he had prostate ca  . Depression   . GERD (gastroesophageal reflux disease)   . Chronic kidney disease    History reviewed. No pertinent past surgical history. Family History  Problem Relation Age of Onset  . Diabetes Father   . Diabetes Paternal Aunt    Social History  Substance Use Topics  . Smoking status: Never Smoker   . Smokeless tobacco: Never Used  . Alcohol Use: No    Review of Systems  10 Systems reviewed and are negative for acute change except as noted in the HPI.   Allergies  Penicillins  Home Medications   Prior to Admission medications   Medication Sig Start Date End Date Taking? Authorizing Provider  mirtazapine (REMERON) 15 MG tablet Take 15 mg by mouth at  bedtime. 10/19/15  Yes Historical Provider, MD  tamsulosin (FLOMAX) 0.4 MG CAPS capsule Take 1 capsule (0.4 mg total) by mouth daily. Patient not taking: Reported on 11/01/2015 10/05/15   Loleta Chance, MD   BP 146/112 mmHg  Pulse 108  Temp(Src) 97.6 F (36.4 C) (Oral)  Resp 18  SpO2 92% Physical Exam  Constitutional:  Patient is very deconditioned. He has moderate work of breathing at rest. He is awake and interactive.  HENT:  Head: Normocephalic and atraumatic.  Neck: Neck supple.  Cardiovascular:  Tachycardia. No gross rub murmur gallop  Pulmonary/Chest:  Moderate increased work of breathing at rest. Decreased breath sounds both lower lungs. Air flow is very soft with distant breath sounds throughout.  Abdominal: Soft. He exhibits distension. There is no tenderness.  Musculoskeletal:  Patient has bilateral 3+ edema of the lower legs.  Neurological:  Patient is awake and interactive. He seems mildly confused. He does not have good recall.  Skin: Skin is warm and dry.  Psychiatric: He has a normal mood and affect.    ED Course  Procedures (including critical care time) Labs Review Labs Reviewed - No data to display  Imaging Review No results found. I have personally reviewed and evaluated these images and lab results as part of my medical decision-making.   EKG Interpretation None      MDM   Final diagnoses:  Acute on  chronic respiratory failure with hypoxia (HCC)  ESRD (end stage renal disease) [facility])  Hospice care patient   Patient's case was reviewed with Dr. Nyra Capes. Ranges have been made at home for supplemental oxygen and medications for sedation and pain. The patient has responded positively to supplemental oxygen and DuoNeb. He does not complain of pain. He was given a 1 mg dose of Ativan for increased work of breathing. Plan will be to return home for home management of stage IV cancer, end-stage renal disease not taking dialysis and respiratory  failure.    Charlesetta Shanks, MD 11/01/15 2035

## 2015-11-01 NOTE — Discharge Instructions (Signed)
Respiratory failure is when your lungs are not working well and your breathing (respiratory) system fails. When respiratory failure occurs, it is difficult for your lungs to get enough oxygen, get rid of carbon dioxide, or both. Respiratory failure can be life threatening.  Respiratory failure can be acute or chronic. Acute respiratory failure is sudden, severe, and requires emergency medical treatment. Chronic respiratory failure is less severe, happens over time, and requires ongoing treatment.  WHAT ARE THE CAUSES OF ACUTE RESPIRATORY FAILURE?  Any problem affecting the heart or lungs can cause acute respiratory failure. Some of these causes include the following:  Chronic bronchitis and emphysema (COPD).   Blood clot going to a lung (pulmonary embolism).   Having water in the lungs caused by heart failure, lung injury, or infection (pulmonary edema).   Collapsed lung (pneumothorax).   Pneumonia.   Pulmonary fibrosis.   Obesity.   Asthma.   Heart failure.   Any type of trauma to the chest that can make breathing difficult.   Nerve or muscle diseases making chest movements difficult. HOW WILL MY ACUTE RESPIRATORY FAILURE BE TREATED?  Treatment of acute respiratory failure depends on the cause of the respiratory failure. Usually, you will stay in the intensive care unit so your breathing can be watched closely. Treatment can include the following:  Oxygen. Oxygen can be delivered through the following:  Nasal cannula. This is small tubing that goes in your nose to give you oxygen.  Face mask. A face mask covers your nose and mouth to give you oxygen.  Medicine. Different medicines can be given to help with breathing. These can include:  Nebulizers. Nebulizers deliver medicines to open the air passages (bronchodilators). These medicines help to open or relax the airways in the lungs so you can breathe better. They can also help loosen mucus from your  lungs.  Diuretics. Diuretic medicines can help you breathe better by getting rid of extra water in your body.  Steroids. Steroid medicines can help decrease swelling (inflammation) in your lungs.  Antibiotics.  Chest tube. If you have a collapsed lung (pneumothorax), a chest tube is placed to help reinflate the lung.  Noninvasive positive pressure ventilation (NPPV). This is a tight-fitting mask that goes over your nose and mouth. The mask has tubing that is attached to a machine. The machine blows air into the tubing, which helps to keep the tiny air sacs (alveoli) in your lungs open. This machine allows you to breathe on your own.  Ventilator. A ventilator is a breathing machine. When on a ventilator, a breathing tube is put into the lungs. A ventilator is used when you can no longer breathe well enough on your own. You may have low oxygen levels or high carbon dioxide (CO2) levels in your blood. When you are on a ventilator, sedation and pain medicines are given to make you sleep so your lungs can heal. SEEK IMMEDIATE MEDICAL CARE IF:  You have shortness of breath (dyspnea) with or without activity.  You have rapid breathing (tachypnea).  You are wheezing.  You are unable to say more than a few words without having to catch your breath.  You find it very difficult to function normally.  You have a fast heart rate.  You have a bluish color to your finger or toe nail beds.  You have confusion or drowsiness or both.   This information is not intended to replace advice given to you by your health care provider. Make sure you discuss  any questions you have with your health care provider.   Document Released: 08/16/2013 Document Revised: 05/02/2015 Document Reviewed: 08/16/2013 Elsevier Interactive Patient Education 2016 [place] is a service that is designed to provide people who are terminally ill and their families with medical, spiritual, and psychological  support. Its aim is to improve your quality of life by keeping you as alert and comfortable as possible. Hospice is performed by a team of health care professionals and volunteers who:  Help keep you comfortable. Hospice can be provided in your home or in a homelike setting. The hospice staff works with your family and friends to help meet your needs. You will enjoy the support of loved ones by receiving much of your basic care from family and friends.  Provide pain relief and manage your symptoms. The staff supply all necessary medicines and equipment.  Provide companionship when you are alone.  Allow you and your family to rest. They may do light housekeeping, prepare meals, and run errands.  Provide counseling. They will make sure your emotional, spiritual, and social needs and those of your family are being met.  Provide spiritual care. Spiritual care is individualized to meet your needs and your family's needs. It may involve helping you look at what death means to you, say goodbye, or perform a specific religious ceremony or ritual. Hospice teams often include:  A nurse.  A doctor.  Social workers.  Religious leaders (such as a Clinical biochemist).  Trained volunteers. WHEN SHOULD HOSPICE CARE BEGIN? Most people who use hospice are believed to have fewer than 6 months to live. Your family and health care providers can help you decide when hospice services should begin. If your condition improves, you may discontinue the program. WHAT Love? Most hospice programs are run by nonprofit, independent organizations. Some are affiliated with hospitals, nursing homes, or home health care agencies. Hospice programs can take place in the home or at a hospice center, hospital, or skilled nursing facility. When choosing a hospice program, ask the following questions:  What services are available to me?  What services are offered to my loved ones?  How involved  are my loved ones?  How involved is my health care provider?  Who makes up the hospice care team? How are they trained or screened?  How will my pain and symptoms be managed?  If my circumstances change, can the services be provided in a different setting, such as my home or in the hospital?  Is the program reviewed and licensed by the state or certified in some other way? WHERE CAN I LEARN MORE ABOUT HOSPICE? You can learn about existing hospice programs in your area from your health care providers. You can also read more about hospice online. The websites of the following organizations contain helpful information:  The [facility] and Palliative Care Organization [facility]).  The Hospice Association of America ([place]).  The [place].  The American Cancer Society (ACS).  Hospice Net.   This information is not intended to replace advice given to you by your health care provider. Make sure you discuss any questions you have with your health care provider.   Document Released: 11/28/2003 Document Revised: 08/16/2013 Document Reviewed: 06/21/2013 Elsevier Interactive Patient Education [employer].

## 2015-11-01 NOTE — ED Notes (Signed)
Pt's sister is requesting someone complete another DNR.  She stated "I think his son picked it up accidentally."

## 2015-11-01 NOTE — ED Notes (Signed)
Per [facility], pt from home, is Stage IV cancer, hospice nurse called d/t thinking he may have aspirated after vomiting this morning.  He's a DNR but they couldn't produce the paperwork.  His LOC changes day to day.

## 2015-11-01 NOTE — ED Notes (Signed)
Bed: WA04 Expected date:  Expected time:  Means of arrival:  Comments: 63M/?aspiration/vomiting

## 2015-11-01 NOTE — Patient Outreach (Signed)
Today I made a courtesy visit to see pt and close his case. He is now on full Hospice services. Plan is for his to stay in his sister's home until he is near end of life and then go to the Hospice home.  Harold Hedge, pt's brother-in-law reports Mr. Digges is declining rapidly. He still takes him to [facility] every morning but Mr. Sammie Bench has noted that the pt's mentation has worsened and often he stares vacantly without answering questions. He also is noted to have various twitches of his body.  Mr. Whittiker greets me and answers my questions. He says he is sleeping better since starting the mirtazapine. His appetite continues to dwindle. He says he is still urinating some. He denies any pain.  I am going to close his case since Hospice is involved but I will still visit pt like today every 2 weeks to offer support.    The family has agreed to call me if his condition changes.  I also spoke with pt's son, Arshia Egnew, who called me shortly after I left Mr. Tafur home. I updated him on my assessment of his father and reassured him that the Hospice team is looking after him wll and that [facility] and [facility] are being very attentive. He expressed his appreciation of my support and services.  I will visit pt again in 2 weeks or prn.  Deloria Lair [facility] [phone]

## 2015-11-02 ENCOUNTER — Other Ambulatory Visit: Payer: Self-pay | Admitting: *Deleted

## 2015-11-02 ENCOUNTER — Inpatient Hospital Stay (HOSPITAL_COMMUNITY)
Admission: EM | Admit: 2015-11-02 | Discharge: 2015-11-24 | DRG: 951 | Disposition: E | Payer: Medicare Other | Attending: Infectious Disease | Admitting: Infectious Disease

## 2015-11-02 ENCOUNTER — Encounter (HOSPITAL_COMMUNITY): Payer: Self-pay

## 2015-11-02 DIAGNOSIS — N186 End stage renal disease: Secondary | ICD-10-CM | POA: Diagnosis present

## 2015-11-02 DIAGNOSIS — Z515 Encounter for palliative care: Secondary | ICD-10-CM

## 2015-11-02 DIAGNOSIS — R0902 Hypoxemia: Secondary | ICD-10-CM | POA: Diagnosis present

## 2015-11-02 MED ORDER — ONDANSETRON 4 MG PO TBDP: 4.0000 mg | ORAL_TABLET | Freq: Four times a day (QID) | ORAL | Status: DC | PRN

## 2015-11-02 MED ORDER — ACETAMINOPHEN 325 MG PO TABS: 650.0000 mg | ORAL_TABLET | Freq: Four times a day (QID) | ORAL | Status: DC | PRN

## 2015-11-02 MED ORDER — LORAZEPAM 1 MG PO TABS: 1.0000 mg | ORAL_TABLET | ORAL | Status: DC | PRN

## 2015-11-02 MED ORDER — MORPHINE SULFATE (CONCENTRATE) 10 MG/0.5ML PO SOLN: 5.0000 mg | ORAL | Status: DC | PRN

## 2015-11-02 MED ORDER — MORPHINE SULFATE (PF) 2 MG/ML IV SOLN
1.0000 mg | INTRAVENOUS | Status: DC | PRN
  Administered 2015-11-02 – 2015-11-03 (×4): 1 mg via INTRAVENOUS
  Filled 2015-11-02 (×4): qty 1

## 2015-11-02 MED ORDER — LORAZEPAM 2 MG/ML IJ SOLN
1.0000 mg | INTRAMUSCULAR | Status: DC | PRN
  Administered 2015-11-02 – 2015-11-03 (×4): 1 mg via INTRAVENOUS
  Filled 2015-11-02 (×5): qty 1

## 2015-11-02 MED ORDER — ACETAMINOPHEN 650 MG RE SUPP: 650.0000 mg | Freq: Four times a day (QID) | RECTAL | Status: DC | PRN

## 2015-11-02 MED ORDER — ALBUTEROL SULFATE (2.5 MG/3ML) 0.083% IN NEBU: 2.5000 mg | INHALATION_SOLUTION | RESPIRATORY_TRACT | Status: DC | PRN

## 2015-11-02 MED ORDER — ONDANSETRON HCL 4 MG/2ML IJ SOLN: 4.0000 mg | Freq: Four times a day (QID) | INTRAMUSCULAR | Status: DC | PRN

## 2015-11-02 MED ORDER — MORPHINE SULFATE (CONCENTRATE) 10 MG/0.5ML PO SOLN
5.0000 mg | ORAL | Status: DC | PRN
  Administered 2015-11-03: 5 mg via SUBLINGUAL
  Filled 2015-11-02: qty 0.5

## 2015-11-02 MED ORDER — LORAZEPAM 2 MG/ML PO CONC
1.0000 mg | ORAL | Status: DC | PRN
  Filled 2015-11-02: qty 0.5

## 2015-11-02 NOTE — H&P (Signed)
Date: 11/02/2015               Patient Name:  [name] MRN: [id number]  DOB: 08-22-1941 Age / Sex: 75 y.o., male   PCP: Betty G Martinique, MD         Medical Service: Internal Medicine Teaching Service         Attending Physician: Dr. Truman Hayward, MD    First Contact: Dr. Lindon Romp Pager: [phone]  Second Contact: Dr. Jacques Earthly Pager: [phone]       After Hours (After 5p/  First Contact Pager: [phone]  weekends / holidays): Second Contact Pager: [phone]   Chief Complaint: Shortness of breath  History of Present Illness: [name] is a 75 year old man with a past medical history significant for ESRD who declined dialysis and placed on home hospice presenting today with hypoxia. Patient was recently admitted in February with confusion and found to have progression of his CKD to ESRD. He declined dialysis at that time and reported he preferred to go home and have a peaceful death. Palliative was consulted at that time and he was discharged with home hospice. Hospice nurse reports she saw him yesterday and found him hypoxic to the 70s. Patient had no home O2 or pain medications at that time and he was taken to [facility]. He responded well to O2 and duonebs in the ED and was sent home with supplemental O2 and Rx for morphine and ativan. The hospice nurse went to check on him again today and found him hypoxic with the nasal canula off his nose and the family had no filled the Rx for morphine or ativan. She spoke with [facility] to arrange for respite care over the weekend and they accepted to take the patient. En route to [facility], he became increasingly labored and hypoxic requiring nonrebreather. [facility] requested patient be taken to the ED. The hospice nurse was contacted and recommended the patient be admitted for hospice care versus placement. Social work was consulted and in-patient hospice could not be arranged tonight and he would need to be admitted overnight. Patient denied  any complaints other than shortness of breath in the ED and declined any further  interventions.   Meds: Current Facility-Administered Medications  Medication Dose Route Frequency Provider Last Rate Last Dose  . acetaminophen (TYLENOL) tablet 650 mg  650 mg Oral Q6H PRN Addison Lank, MD       Or  . acetaminophen (TYLENOL) suppository 650 mg  650 mg Rectal Q6H PRN Addison Lank, MD      . LORazepam (ATIVAN) tablet 1 mg  1 mg Oral Q4H PRN Addison Lank, MD       Or  . LORazepam (ATIVAN) 2 MG/ML concentrated solution 1 mg  1 mg Sublingual Q4H PRN Addison Lank, MD       Or  . LORazepam (ATIVAN) injection 1 mg  1 mg Intravenous Q4H PRN Addison Lank, MD      . morphine 2 MG/ML injection 1 mg  1 mg Intravenous Q2H PRN Addison Lank, MD      . morphine CONCENTRATE 10 MG/0.5ML oral solution 5 mg  5 mg Oral Q2H PRN Addison Lank, MD       Or  . morphine CONCENTRATE 10 MG/0.5ML oral solution 5 mg  5 mg Sublingual Q2H PRN Addison Lank, MD      . ondansetron (ZOFRAN-ODT) disintegrating tablet 4 mg  4 mg Oral Q6H PRN Addison Lank, MD  Or  . ondansetron (ZOFRAN) injection 4 mg  4 mg Intravenous Q6H PRN Addison Lank, MD       Current Outpatient Prescriptions  Medication Sig Dispense Refill  . mirtazapine (REMERON) 15 MG tablet Take 15 mg by mouth at bedtime.      Allergies: Allergies as of 11/16/2015 - Review Complete 11/06/2015  Allergen Reaction Noted  . Penicillins Hives 08/05/2011   Past Medical History  Diagnosis Date  . Hypertension   . Diabetes mellitus   . Hyperlipemia   . Arthritis   . Stroke ([place])   . Anemia   . Anxiety   . Asthma   . Blood transfusion without reported diagnosis   . Cancer [facility])     Was told he had prostate ca  . Depression   . GERD (gastroesophageal reflux disease)   . Chronic kidney disease    History reviewed. No pertinent past surgical history. Family History  Problem Relation Age of Onset  . Diabetes Father   . Diabetes Paternal Aunt     Social History   Social History  . Marital Status: Divorced    Spouse Name: N/A  . Number of Children: N/A  . Years of Education: N/A   Occupational History  . Not on file.   Social History Main Topics  . Smoking status: Never Smoker   . Smokeless tobacco: Never Used  . Alcohol Use: No  . Drug Use: No  . Sexual Activity: No   Other Topics Concern  . Not on file   Social History Narrative    Review of Systems: Pertinent items noted in HPI and remainder of comprehensive ROS otherwise negative.  Physical Exam: Blood pressure 132/79, pulse 94, temperature 97.4 F (36.3 C), temperature source Oral, resp. rate 24, height 5\' 8"  (1.727 m), weight 202 lb (91.627 kg), SpO2 86 %. General: chronically ill appearing male, laying in bed with NRB in place, in moderate respiratory distress  Head: normocephalic and atraumatic.  Eyes: vision grossly intact, pupils equal, pupils round, pupils reactive to light, no injection and anicteric.  Mouth: pharynx pink and moist, no erythema, and no exudates.  Neck: supple, full ROM, no thyromegaly, no JVD, and no carotid bruits.  Lungs: tachypneic, accessory muscle use, diffuse coarse breath sounds and crackles, no wheezes Heart: tachycardic, regular rhythm, no murmur, no gallop, and no rub.  Abdomen: soft, non-tender, normal bowel sounds, no distention, no guarding, no rebound tenderness Msk: no joint swelling, no joint warmth, and no redness over joints.  Pulses: 2+ DP/PT pulses bilaterally Extremities: No cyanosis, clubbing, edema Neurologic: cranial nerves II-XII intact, strength normal in all extremities, sensation intact to light touch, and gait normal.   Lab results: None  Imaging results:  No results found.  Assessment & Plan by Problem:  End of life care: Patient found to be hypoxic with no comfort care medications and sent to the ED. Patient with ESRD opted not receive dialysis and placed on home hospice a month ago. Social work  unable to find placement in inpatient hospice tonight and admitted for comfort measures.  We have placed him on full comfort, prn orders for pain, anxiety, nausea.  No vitals or tele. I spoke with home hospice doctor and RN.  Family is present and aware he may pass tonight or in the next few days. Will contact social work to help with placement hopefully tomorrow. Patient's wishes were to pass at home but family appears overwhelmed with the situation and unable to keep him  comfortable at this time.  -Ativan prn -Morphine prn -Supplemental O2 -Social work consulted  Dispo: Disposition is deferred at this time, awaiting improvement of current medical problems. Anticipated discharge in approximately 1-2 day(s).   The patient does have a current PCP (Betty G Martinique, MD) and does not need an [facility] hospital follow-up appointment after discharge.  The patient does not have transportation limitations that hinder transportation to clinic appointments.  Signed: Maryellen Pile, MD 11/19/2015, 8:09 PM

## 2015-11-02 NOTE — ED Notes (Signed)
Danielle pt's hospice nurse [phone] called and informed this RN that pt was accepted at [facility] and was being transported by [facility]. Pt is actively dying according to hospice RN and at his baseline. During transport, PTAR noticed he became increasingly labored and required nonrebreather. PTAR called [facility] and nursing administrator at [facility] informed PTAR they would not accept pt. PTAR brought pt to ED. Hospice RN states that pt needs to either be admitted here or go to hospice care- pt's sister is primary care giver and is in her 55's. Unable to provide total care for pt.

## 2015-11-02 NOTE — Patient Outreach (Signed)
[facility]) Care Management  10/31/2015  [name] [birth year] [id number]   CSW will perform a case closure on patient, as patient has elected Hospice services.  Nat Christen, BSW, MSW, LCSW  Licensed Education officer, environmental Health System  Mailing [place] [address], [place], [place] [address] Physical Address-[address], [place],  [address] Toll Free Main # [phone] Fax # [phone] Cell # [phone]  Fax # [phone]  [name]   [employer]  Discrimination is Against the [employer]. and its subsidiaries comply with applicable Federal civil rights laws and do not discriminate on the basis of race, color, national origin, age, disability, or sex. [place] do not exclude people or treat them differently because of race, color, national origin, age, disability, or sex.    [employer]. and its subsidiaries provide:  . Free auxiliary aids and services, such as qualified sign language interpreters, video remote interpretation, and written information in other formats to people with disabilities when such auxiliary aids and services are necessary to ensure an equal opportunity to participate. . Free language services to people whose primary language is not English when those services are necessary to provide meaningful access, such as translated documents or oral interpretation.    If you need these services, call [phone] or if you use a TTY, call 711.   If you believe that [employer]. and its subsidiaries have failed to provide these services or discriminated in another way on the basis of race, color, national origin, age, disability, or sex, you can file a Tourist information centre manager with:   Discrimination [place]riev[place] [address]   If you need help filing a grievance, call [phone] or if you use a TTY, call 711.  You can  also file a civil rights complaint with the U.S. Department of Health and Financial controller, Office for Civil Rights electronically through the Office for Civil Rights Complaint Portal, available at OnSiteLending.nl.jsf, or by mail or phone at:   [place]. Department of Health and Human Services  [place], [place], [facility] Building  [place], [place]  [phone], [phone] (TDD)  Complaint forms are available at CutFunds.si             [place]: ATTENTION: If you do not speak English, language assistance services, free of charge, are available to you. Call [phone]  (TTY: [id number]).  Espaol (Spanish): ATENCIN: si habla espaol, tiene a su disposicin servicios gratuitos de asistencia lingstica. Llame al [phone] (TTY: [id number]).  ???? (Chinese): ?????????????????????????????? [phone] (TTY:711??  Ti?ng Vi?t (Vietnamese): CH : N?u b?n ni Ti?ng Vi?t, c cc d?ch v? h? tr? ngn ng? mi?n ph dnh cho b?n. G?i s? [phone] (TTY: [id number]).  ??? ([place]): ?? : ???? ????? ?? , ?? ?? ???? ??? ???? ? ???? . [phone] (TTY: 711)??? ??? ???? .  Tagalog (Tagalog - Filipino): PAUNAWA: Kung nagsasalita ka ng Tagalog, maaari kang gumamit ng mga serbisyo ng tulong sa wika nang walang bayad. [place] sa [phone] (TTY: [id number]).   [place]): :      ,      .  [phone] (: [id number]).  Kreyl Ayisyen ([place]): ATANSYON: Si w pale Ethelene Hal, gen svis d pou lang ki disponib gratis pou ou. Rele [phone] (TTY: [id number]).  Fonnie Jarvis Marland Kitchen[place]): ATTENTION : Si vous parlez franais, des services d'aide linguistique vous sont proposs gratuitement. Appelez le [phone] (  ATS : [id number]).  Polski (Polish): UWAGA: Jeeli mwisz po polsku, moesz skorzysta z  bezpatnej pomocy jzykowej. Zadzwo pod numer [phone] (TTY: [id number]).  Portugus ([place]): ATENO: Se fala portugus, encontram-se disponveis servios lingusticos, grtis. Ligue para [phone] (TTY: [id number]).   Italiano ([place]): ATTENZIONE: In caso la lingua parlata sia l'italiano, sono disponibili servizi di assistenza linguistica gratuiti. Chiamare il numero [phone] (TTY: [id number]).  Dawayne Patricia ([place]): ACHTUNG: Wenn Sie Deutsch sprechen, stehen Ihnen kostenlos sprachliche Hilfsdienstleistungen zur [employer]. [place]num[place]: [phone] (TTY: [id number]).   (Arabic): [phone]   .            : .)[id number] :   (  ??? ([place]): ??????????????????????????????????[phone] ?TTY?711?????????????????  ? (Farsi): [phone]  . ?   ? ?  ? ? ?~ ?  ?    : .??  (TTY: 711)  Din Bizaad ([place]): D77 baa ak0 n7n7zin: D77 saad bee y1n7[ti'go Risa Grill, saad bee [id number]'d66', t'11 Pricilla Loveless n1 h0l=, koj8' [id number] [phone] (TTY: [id number]).

## 2015-11-02 NOTE — ED Notes (Signed)
Pt presents to er on a non-rebreather mask with complaints of shortness of breath, denies any pain. Ems states that the patient was on the way to [facility] to take the patient there to stay but on the way he started having shortness of breath and his oxygen levels would not stay above 88%, his hospice nurse talked with [facility] RN and stated he needs to go to a hospice care facility from here or be admitted for hospice care and that this is his baseline

## 2015-11-02 NOTE — ED Provider Notes (Signed)
CSN: [id number]     Arrival date & time 11/06/2015  1540 History   First MD Initiated Contact with Patient 11/14/2015 1610     Chief Complaint  Patient presents with  . Shortness of Breath     (Consider location/radiation/quality/duration/timing/severity/associated sxs/prior Treatment) The history is provided by the patient and a relative.    75 year old male with a past medical history listed below mostly significant for ESRD who declined dialysis and is currently on hospice care presents to the ED for hypoxia. Patient was being transported from home to a facility for respite care when he became acutely hypoxic requiring nonrebreather. Transport contacted the facility who requested the patient be taken to the emergency department. Hospice nurse who has been seen the patient at home contacted [place] and recommended the patient be admitted for hospice care versus placement. Patient currently denies any complaints other than some shortness of breath. He is maintaining on 3 L nasal cannula. He is currently declining any intervention.  Past Medical History  Diagnosis Date  . Hypertension   . Diabetes mellitus   . Hyperlipemia   . Arthritis   . Stroke ([place])   . Anemia   . Anxiety   . Asthma   . Blood transfusion without reported diagnosis   . Cancer [facility])     Was told he had prostate ca  . Depression   . GERD (gastroesophageal reflux disease)   . Chronic kidney disease    History reviewed. No pertinent past surgical history. Family History  Problem Relation Age of Onset  . Diabetes Father   . Diabetes Paternal Aunt    Social History  Substance Use Topics  . Smoking status: Never Smoker   . Smokeless tobacco: Never Used  . Alcohol Use: No    Review of Systems  Constitutional: Negative for fever, chills, appetite change and fatigue.  HENT: Negative for congestion, ear pain, facial swelling, mouth sores and sore throat.   Eyes: Negative for visual disturbance.  Respiratory: Positive  for shortness of breath. Negative for cough and chest tightness.   Cardiovascular: Negative for chest pain and palpitations.  Gastrointestinal: Negative for nausea, vomiting, abdominal pain, diarrhea and blood in stool.  Endocrine: Negative for cold intolerance and heat intolerance.  Genitourinary: Negative for frequency, decreased urine volume and difficulty urinating.  Musculoskeletal: Negative for back pain and neck stiffness.  Skin: Negative for rash.  Neurological: Negative for dizziness, weakness, light-headedness and headaches.  All other systems reviewed and are negative.     Allergies  Penicillins  Home Medications   Prior to Admission medications   Medication Sig Start Date End Date Taking? Authorizing Provider  mirtazapine (REMERON) 15 MG tablet Take 15 mg by mouth at bedtime. 10/19/15  Yes Historical Provider, MD   BP 181/95 mmHg  Pulse 105  Temp(Src) 97.4 F (36.3 C) (Oral)  Resp 28  Ht 5\' 8"  (1.727 m)  Wt 91.627 kg  BMI 30.72 kg/m2  SpO2 88% Physical Exam  Constitutional: He is oriented to person, place, and time. He appears well-nourished. No distress.  HENT:  Head: Normocephalic and atraumatic.  Right Ear: External ear normal.  Left Ear: External ear normal.  Eyes: Pupils are equal, round, and reactive to light. Right eye exhibits no discharge. Left eye exhibits no discharge. No scleral icterus.  Neck: Normal range of motion. Neck supple.  Cardiovascular: Normal rate.  Exam reveals no gallop and no friction rub.   No murmur heard. Pulmonary/Chest: Effort normal and breath sounds normal.  No stridor. Tachypnea noted. No respiratory distress. He has no wheezes. He has no rales. He exhibits no tenderness.  Abdominal: Soft. He exhibits no distension and no mass. There is no tenderness. There is no rebound and no guarding.  Musculoskeletal: He exhibits no edema or tenderness.  Neurological: He is alert and oriented to person, place, and time.  Skin: Skin is warm  and dry. No rash noted. He is not diaphoretic. No erythema.    ED Course  Procedures (including critical care time) Labs Review Labs Reviewed - No data to display  Imaging Review No results found. I have personally reviewed and evaluated these images and lab results as part of my medical decision-making.   EKG Interpretation   Date/Time:  Friday November 02 2015 16:04:50 EST Ventricular Rate:  99 PR Interval:  145 QRS Duration: 130 QT Interval:  430 QTC Calculation: 552 R Axis:   -68 Text Interpretation:  Sinus tachycardia Atrial premature complexes RBBB  and LAFB Nonspecific ST and T wave abnormality No acute changes ST  depression in the lateral leads noted Confirmed by Kathrynn Humble, MD, Thelma Comp  [phone]) on 10/24/2015 4:20:06 PM      MDM   75 year old male on hospice care presents for hypoxia. I spoke with the nurses [facility] which is a facility he was being transported to, who mentioned that the patient was being transported there only for temporary respite care.  No intervention needed. Patient is maintaining saturations on 3 L nasal cannula. We will admit the patient for continuing of comfort care and likely placement to hospice facility.   Patient was seen in conjunction with Dr. Kathrynn Humble  Final diagnoses:  Hypoxia        Addison Lank, MD 11-23-15 [id number]  Varney Biles, MD 11/23/2015 [id number]

## 2015-11-03 DIAGNOSIS — R0902 Hypoxemia: Secondary | ICD-10-CM

## 2015-11-03 DIAGNOSIS — I120 Hypertensive chronic kidney disease with stage 5 chronic kidney disease or end stage renal disease: Secondary | ICD-10-CM | POA: Diagnosis present

## 2015-11-03 DIAGNOSIS — E1122 Type 2 diabetes mellitus with diabetic chronic kidney disease: Secondary | ICD-10-CM | POA: Diagnosis present

## 2015-11-03 DIAGNOSIS — N186 End stage renal disease: Secondary | ICD-10-CM | POA: Diagnosis present

## 2015-11-03 DIAGNOSIS — Z515 Encounter for palliative care: Secondary | ICD-10-CM | POA: Diagnosis present

## 2015-11-03 DIAGNOSIS — Z8673 Personal history of transient ischemic attack (TIA), and cerebral infarction without residual deficits: Secondary | ICD-10-CM | POA: Diagnosis not present

## 2015-11-03 DIAGNOSIS — Z8546 Personal history of malignant neoplasm of prostate: Secondary | ICD-10-CM | POA: Diagnosis not present

## 2015-11-03 MED ORDER — GLYCOPYRROLATE NICU ORAL SYRINGE 0.2 MG/ML
0.1000 mg | Freq: Four times a day (QID) | ORAL | Status: DC | PRN
  Filled 2015-11-03: qty 0.5

## 2015-11-03 MED ORDER — MORPHINE SULFATE (PF) 2 MG/ML IV SOLN
1.0000 mg | INTRAVENOUS | Status: DC | PRN
  Administered 2015-11-03 (×2): 1 mg via INTRAVENOUS
  Filled 2015-11-03 (×2): qty 1

## 2015-11-03 MED ORDER — GLYCOPYRROLATE 0.2 MG/ML IJ SOLN
0.1000 mg | Freq: Four times a day (QID) | INTRAMUSCULAR | Status: DC | PRN
  Administered 2015-11-03: 0.1 mg via ORAL
  Filled 2015-11-03: qty 1

## 2015-11-13 ENCOUNTER — Ambulatory Visit: Payer: Medicare PPO | Admitting: *Deleted

## 2015-11-24 NOTE — Progress Notes (Signed)
Patient expired at 2058 with family at bedside. Family chose [facility] in [place]. Patient placement and physican notified. Patient taken to morgue. Jimmie Molly, RN

## 2015-11-24 NOTE — Progress Notes (Signed)
CSW received consult for hospice placement- spoke with pt son who is agreeable and prefers [facility]  Referrals faxed to [place] and [place] to follow up 3/12 for availability  CSW will continue to follow  [name], [place] Worker [phone]

## 2015-11-24 NOTE — Discharge Summary (Signed)
  Name: [name] MRN: [id number] DOB: 1940-09-01 75 y.o.  Date of Admission: 11/07/2015  3:40 PM Date of Discharge: 11/04/2015 Attending Physician: No att. providers found  Discharge Diagnosis: Principal Problem:   Admission for end of life care Active Problems:   ESRD (end stage renal disease) ([place])   Hypoxia  Cause of death: End stage renal disease Time of death: 12-21-56 on 11-30-2015  Disposition and follow-up:   Mr.[name] was discharged from [facility] in expired condition.    Hospital Course: 75 year old man with ESRD who declined dialysis and placed on home hospice presented with hypoxia. Patient was recently admitted in February with confusion and found to have progression of his CKD to ESRD. He declined dialysis at that time and reported he preferred to go home and have a peaceful death. Palliative was consulted at that time and he was discharged with home hospice. Hospice nurse reports she saw him yesterday and found him hypoxic to the 83s. Patient had no home O2 or pain medications at that time and he was taken to [facility]. He responded well to O2 and duonebs in the ED and was sent home with supplemental O2 and Rx for morphine and ativan. The hospice nurse went to check on him again today and found him hypoxic with the nasal canula off his nose and the family had no filled the Rx for morphine or ativan. She spoke with [facility] to arrange for respite care over the weekend and they accepted to take the patient. En route to [facility], he became increasingly labored and hypoxic requiring nonrebreather. [facility] requested patient be taken to the ED. The hospice nurse was contacted and recommended the patient be admitted for hospice care versus placement. Social work was consulted and in-patient hospice could not be arranged tonight and he would need to be admitted overnight. Patient denied any complaints other than shortness of breath in the ED and declined any  further interventions. He was placed on comfort measures and passed away 11/30/15 at 12-21-56.  Signed: Milagros Loll, MD 11/04/2015, 7:16 AM

## 2015-11-24 NOTE — Clinical Social Work Note (Signed)
Clinical Social Work Assessment  Patient Details  Name: [name] MRN: [id number] Date of Birth: 1940/11/30  Date of referral:  11-06-15               Reason for consult:  Facility Placement                Permission sought to share information with:  Family Supports Permission granted to share information::  Yes, Verbal Permission Granted  Name::     Water quality scientist::  hospice facilities  Relationship::  son  Contact Information:     Housing/Transportation Living arrangements for the past 2 months:  Single Family Home Source of Information:  Adult Children Patient Interpreter Needed:  None Criminal Activity/Legal Involvement Pertinent to Current Situation/Hospitalization:  No - Comment as needed Significant Relationships:  Adult Children, Siblings Lives with:   (sister) Do you feel safe going back to the place where you live?  No Need for family participation in patient care:  Yes (Comment) (decision making)  Care giving concerns:  Pt was living at home with his sister and receiving home hospice services.  Care was getting too overwhelming for sister to care for at home.   Social Worker assessment / plan:  CSW spoke with pt son concerning plan for time of DC.  Pt came to the ED by [facility] when he was on his way to SNF for respite care stay.  CSW explained that medical team is now recommending residential hospice placement to continue comfort care for patient.  Pt son discussed pts life and the hard times he has had since pt daughters murder in 82- pt son does not feel as if pt has properly taken care of himself since that time.  Employment status:  Retired Nurse, adult PT Recommendations:  [place] / Referral to community resources:  [place]  Patient/Family's Response to care:  Son is agreeable to hospice placement and just wants the pt to be comfortable.  Patient/Family's Understanding of and Emotional  Response to Diagnosis, Current Treatment, and Prognosis:  Pt son understands the severity of the pt condition and is greatful for the care and consideration pt has received at [facility].  Emotional Assessment Appearance:  Appears stated age Attitude/Demeanor/Rapport:  Unable to Assess Affect (typically observed):  Unable to Assess Orientation:    Alcohol / Substance use:  Not Applicable Psych involvement (Current and /or in the community):  No (Comment)  Discharge Needs  Concerns to be addressed:  Care Coordination Readmission within the last 30 days:  Yes Current discharge risk:  Physical Impairment, Terminally ill Barriers to Discharge:  Continued Medical Work up, [facility] not available   Cranford Mon, LCSW 11-06-15, 4:25 PM

## 2015-11-24 NOTE — Progress Notes (Addendum)
CM notes pt is a [facility].  CM called [facility] to inquire as to why this pt was admitted rather than bridged to a residential Hospice if the family did not want the patient to pass at the home.  Bambi states the pt was going to a SNF for a 5 day respite but upon the transport arrival, O2 was sat was in 70s and EMS brought pt to hospital.  CM requested [employer] rep to please pursue GIP with attending physician but [facility] has not received their licensure/certification to provide GIP. CM spoke with attending MD and pt appears to be stabilizing. CM spoke with CSW [place] who is pursuing residential hospice.  No other CM needs were communicated.

## 2015-11-24 NOTE — Progress Notes (Addendum)
   Subjective: No acute events. Unable to answer questions.  Objective: Vital signs in last 24 hours: Filed Vitals:   10/26/2015 1915 11/05/2015 1930 10/24/2015 1945 11/02/15 2102  BP: 138/81 145/86 162/96   Pulse: 95 94 101 102  Temp:    98 F (36.7 C)  TempSrc:    Axillary  Resp: 18 22 25 22   Height:    5\' 11"  (1.803 m)  Weight:    163 lb 8 oz (74.163 kg)  SpO2: 86% 83% 82% 100%   Weight change:   Intake/Output Summary (Last 24 hours) at 2015-11-14 1233 Last data filed at 2015-11-14 1038  Gross per 24 hour  Intake      0 ml  Output      0 ml  Net      0 ml   General Apperance: NAD HEENT: Normocephalic, atraumatic, anicteric sclera Neck: Supple, trachea midline Lungs: Clear to auscultation bilaterally. No wheezes, rhonchi or rales. Breathing comfortably Heart: Regular rate and rhythm Abdomen: Soft, nondistended Extremities: Warm and well perfused  Assessment/Plan: 75 year old man with history of CKD5 who declined dialysis and placed on home hospice presenting with hypoxia and admitted for end of life care.  End of life care:   -Ativan prn -Morphine prn -Supplemental O2 -Social work consulted  Prognosis is <2 weeks.  Dispo: Awaiting facility versus passing away in hospital.    Milagros Loll, MD 11/14/2015, 12:33 PM

## 2015-11-24 NOTE — Progress Notes (Signed)
RN order not to get PT vitals. No vitals were taken

## 2015-11-24 DEATH — deceased

## 2017-11-02 IMAGING — DX DG CHEST 2V
2 series · 2 of 2 positions shown · non-contrast
Comparison: 08/05/2011

CLINICAL DATA: Chest pain 3-4 days ago

EXAM:
CHEST  2 VIEW

[chest lat]
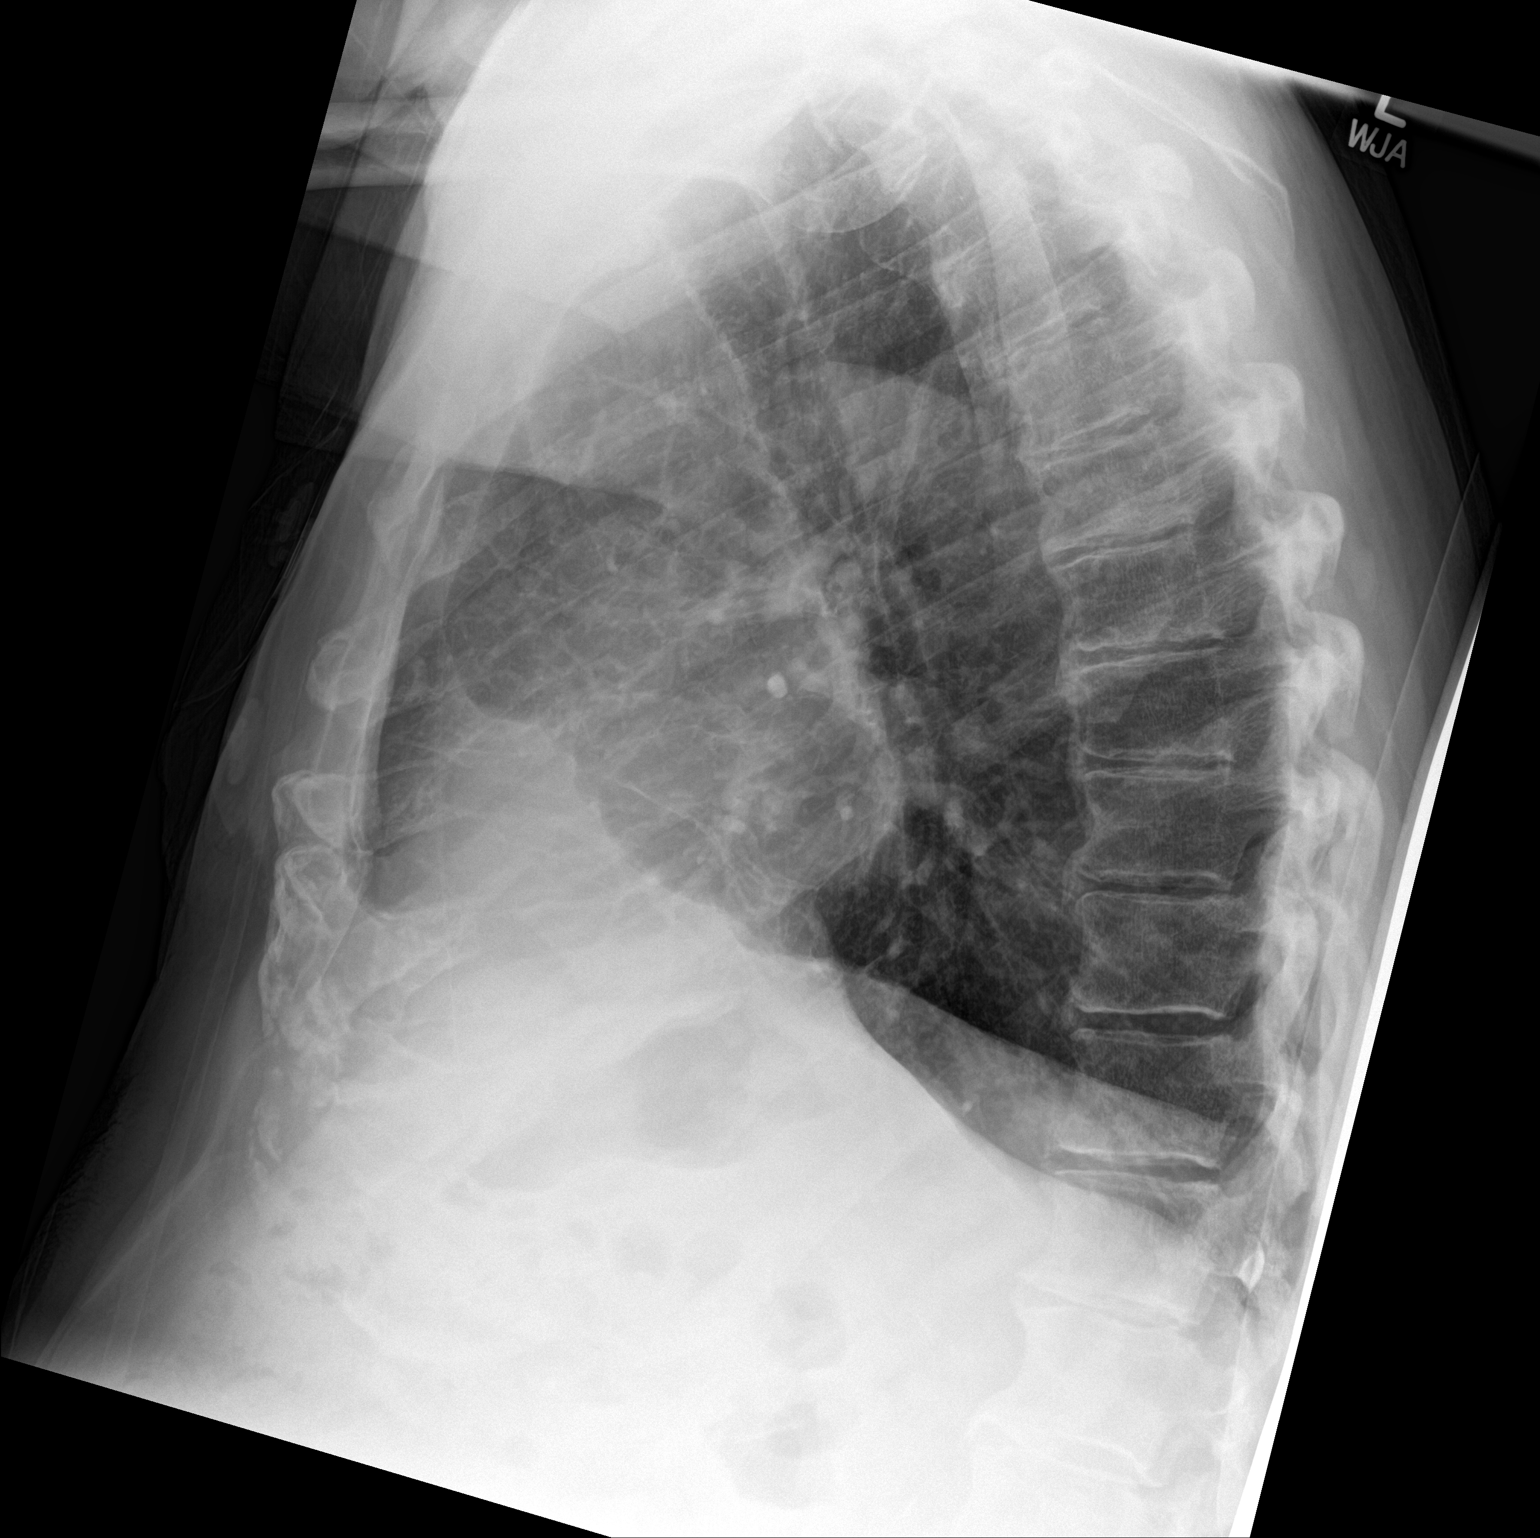

[chest ap]
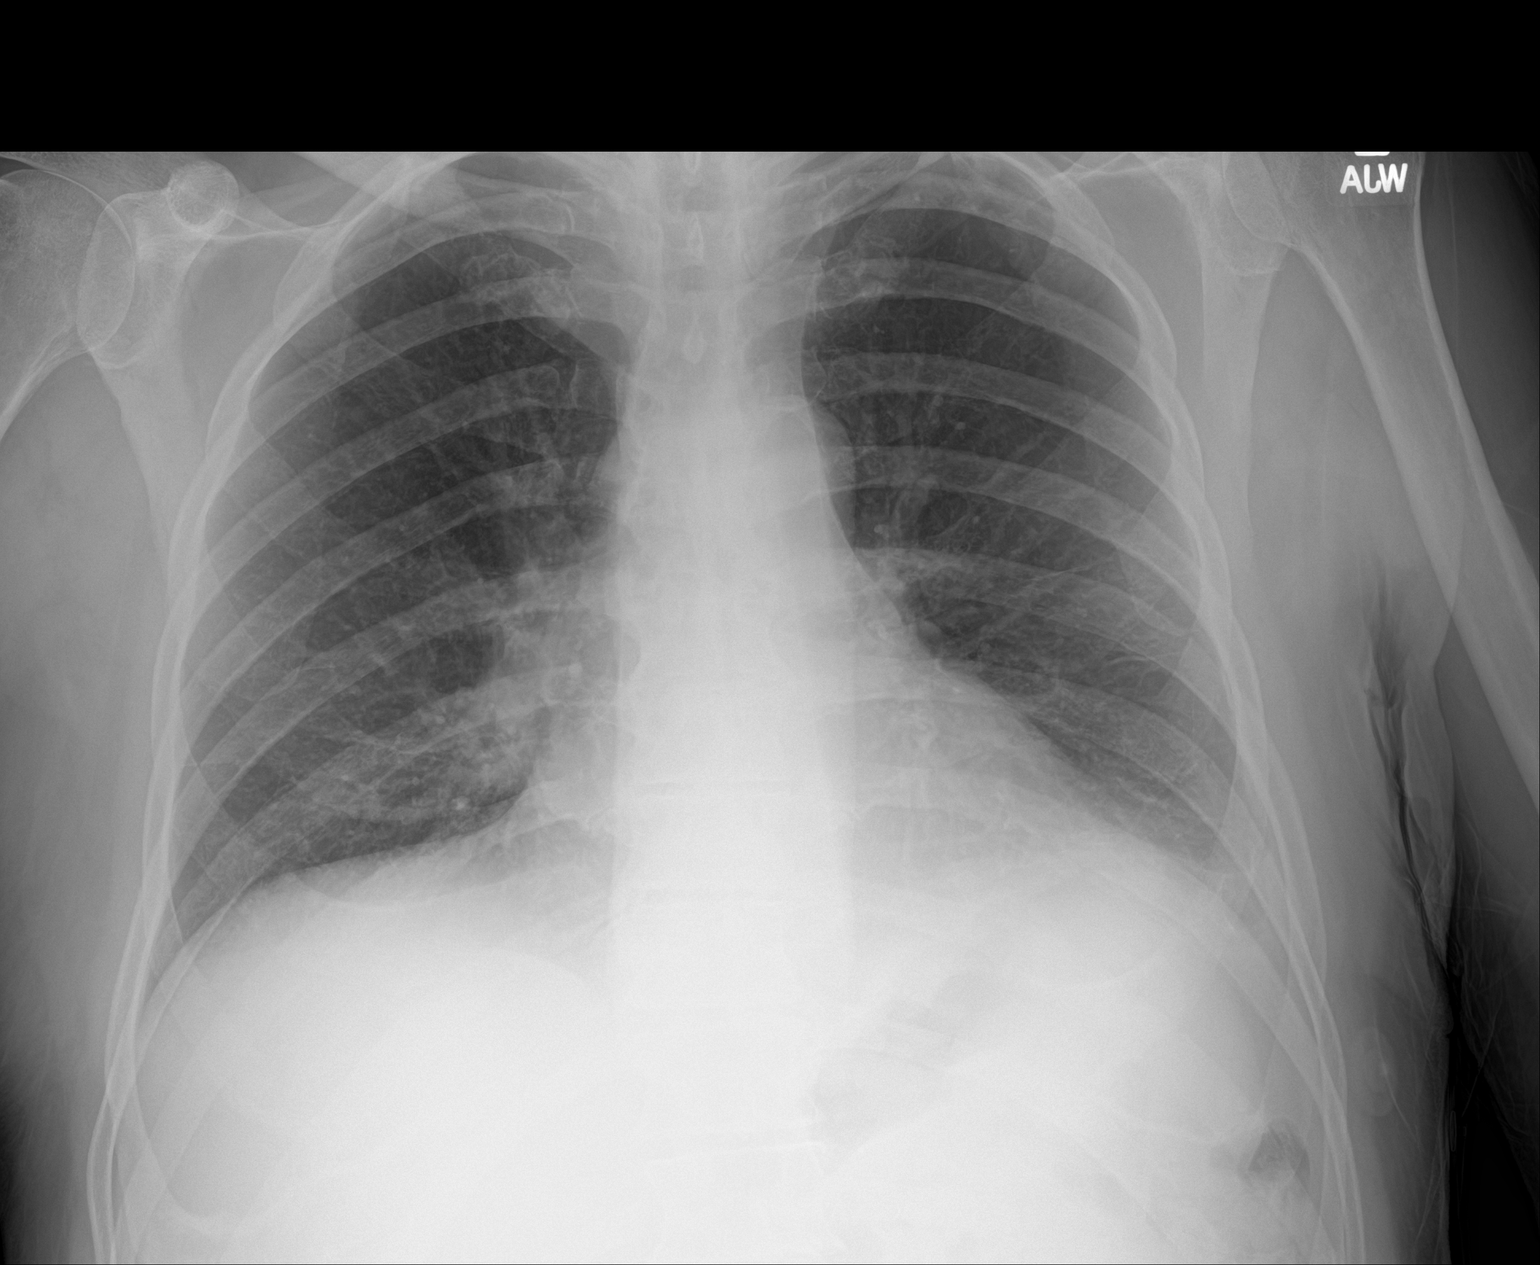

[2 of 2 positions shown; findings below may reference images not displayed]

FINDINGS: Borderline cardiomegaly. Low volumes. Bibasilar atelectasis. Upper
lungs clear. No pneumothorax.
IMPRESSION: Bibasilar atelectasis.
